# Patient Record
Sex: Female | Born: 1975
Health system: Southern US, Community
[De-identification: ages and names within clinical notes are randomized; demographics above are authoritative.]

## PROBLEM LIST (undated history)

## (undated) DIAGNOSIS — Z8632 Personal history of gestational diabetes: Secondary | ICD-10-CM

## (undated) DIAGNOSIS — O2690 Pregnancy related conditions, unspecified, unspecified trimester: Secondary | ICD-10-CM

## (undated) DIAGNOSIS — T7840XA Allergy, unspecified, initial encounter: Secondary | ICD-10-CM

## (undated) DIAGNOSIS — O039 Complete or unspecified spontaneous abortion without complication: Secondary | ICD-10-CM

## (undated) DIAGNOSIS — I10 Essential (primary) hypertension: Secondary | ICD-10-CM

## (undated) DIAGNOSIS — O34219 Maternal care for unspecified type scar from previous cesarean delivery: Secondary | ICD-10-CM

## (undated) HISTORY — DX: Personal history of gestational diabetes: Z86.32

## (undated) HISTORY — DX: Allergy, unspecified, initial encounter: T78.40XA

## (undated) HISTORY — DX: Essential (primary) hypertension: I10

## (undated) HISTORY — PX: BREAST CYST EXCISION: SHX579

## (undated) HISTORY — DX: Complete or unspecified spontaneous abortion without complication: O03.9

---

## 2007-10-14 ENCOUNTER — Inpatient Hospital Stay (HOSPITAL_COMMUNITY): Admission: AD | Admit: 2007-10-14 | Discharge: 2007-10-15 | Payer: Self-pay | Admitting: Obstetrics & Gynecology

## 2007-10-17 ENCOUNTER — Inpatient Hospital Stay (HOSPITAL_COMMUNITY): Admission: AD | Admit: 2007-10-17 | Discharge: 2007-10-17 | Payer: Self-pay | Admitting: Gynecology

## 2007-10-19 ENCOUNTER — Inpatient Hospital Stay (HOSPITAL_COMMUNITY): Admission: AD | Admit: 2007-10-19 | Discharge: 2007-10-19 | Payer: Self-pay | Admitting: Obstetrics & Gynecology

## 2008-02-07 ENCOUNTER — Ambulatory Visit (HOSPITAL_COMMUNITY): Admission: RE | Admit: 2008-02-07 | Discharge: 2008-02-07 | Payer: Self-pay | Admitting: Obstetrics and Gynecology

## 2008-03-06 ENCOUNTER — Ambulatory Visit (HOSPITAL_COMMUNITY): Admission: RE | Admit: 2008-03-06 | Discharge: 2008-03-06 | Payer: Self-pay | Admitting: Obstetrics and Gynecology

## 2008-04-22 ENCOUNTER — Inpatient Hospital Stay (HOSPITAL_COMMUNITY): Admission: AD | Admit: 2008-04-22 | Discharge: 2008-04-22 | Payer: Self-pay | Admitting: Obstetrics and Gynecology

## 2008-05-28 ENCOUNTER — Inpatient Hospital Stay (HOSPITAL_COMMUNITY): Admission: AD | Admit: 2008-05-28 | Discharge: 2008-06-01 | Payer: Self-pay | Admitting: Obstetrics and Gynecology

## 2008-11-06 ENCOUNTER — Ambulatory Visit: Payer: Self-pay | Admitting: Family Medicine

## 2008-11-06 DIAGNOSIS — J45909 Unspecified asthma, uncomplicated: Secondary | ICD-10-CM | POA: Insufficient documentation

## 2008-11-06 DIAGNOSIS — I10 Essential (primary) hypertension: Secondary | ICD-10-CM | POA: Insufficient documentation

## 2008-11-06 DIAGNOSIS — J309 Allergic rhinitis, unspecified: Secondary | ICD-10-CM | POA: Insufficient documentation

## 2008-12-04 ENCOUNTER — Encounter (INDEPENDENT_AMBULATORY_CARE_PROVIDER_SITE_OTHER): Payer: Self-pay | Admitting: *Deleted

## 2008-12-04 ENCOUNTER — Ambulatory Visit: Payer: Self-pay | Admitting: Family Medicine

## 2008-12-04 LAB — CONVERTED CEMR LAB
ALT: 29 units/L (ref 0–35)
AST: 23 units/L (ref 0–37)
Albumin: 4.2 g/dL (ref 3.5–5.2)
Alkaline Phosphatase: 69 units/L (ref 39–117)
Basophils Relative: 0.1 % (ref 0.0–3.0)
Bilirubin, Direct: 0 mg/dL (ref 0.0–0.3)
CO2: 27 meq/L (ref 19–32)
Calcium: 9.1 mg/dL (ref 8.4–10.5)
Chloride: 105 meq/L (ref 96–112)
Creatinine, Ser: 0.7 mg/dL (ref 0.4–1.2)
Eosinophils Absolute: 0.1 10*3/uL (ref 0.0–0.7)
Eosinophils Relative: 1.7 % (ref 0.0–5.0)
Hemoglobin: 14.3 g/dL (ref 12.0–15.0)
Lymphocytes Relative: 37.3 % (ref 12.0–46.0)
MCHC: 34.8 g/dL (ref 30.0–36.0)
Neutro Abs: 3.6 10*3/uL (ref 1.4–7.7)
Neutrophils Relative %: 52.1 % (ref 43.0–77.0)
RBC: 4.69 M/uL (ref 3.87–5.11)
Sodium: 140 meq/L (ref 135–145)
Total CHOL/HDL Ratio: 5
Total Protein: 8.4 g/dL — ABNORMAL HIGH (ref 6.0–8.3)
Triglycerides: 220 mg/dL — ABNORMAL HIGH (ref 0.0–149.0)
VLDL: 44 mg/dL — ABNORMAL HIGH (ref 0.0–40.0)
WBC: 6.9 10*3/uL (ref 4.5–10.5)

## 2009-02-26 ENCOUNTER — Ambulatory Visit: Payer: Self-pay | Admitting: Family Medicine

## 2009-06-11 ENCOUNTER — Ambulatory Visit: Payer: Self-pay | Admitting: Family

## 2009-06-11 DIAGNOSIS — E1169 Type 2 diabetes mellitus with other specified complication: Secondary | ICD-10-CM | POA: Insufficient documentation

## 2009-06-11 DIAGNOSIS — E781 Pure hyperglyceridemia: Secondary | ICD-10-CM | POA: Insufficient documentation

## 2009-06-11 DIAGNOSIS — R7309 Other abnormal glucose: Secondary | ICD-10-CM | POA: Insufficient documentation

## 2009-06-11 LAB — CONVERTED CEMR LAB
BUN: 10 mg/dL (ref 6–23)
CO2: 27 meq/L (ref 19–32)
Calcium: 9.4 mg/dL (ref 8.4–10.5)
Chloride: 107 meq/L (ref 96–112)
Creatinine, Ser: 0.7 mg/dL (ref 0.4–1.2)
Direct LDL: 145.3 mg/dL
Glucose, Bld: 110 mg/dL — ABNORMAL HIGH (ref 70–99)
Total CHOL/HDL Ratio: 4
Triglycerides: 140 mg/dL (ref 0.0–149.0)

## 2009-06-15 ENCOUNTER — Telehealth: Payer: Self-pay | Admitting: Family

## 2009-06-18 ENCOUNTER — Ambulatory Visit: Payer: Self-pay | Admitting: Family

## 2009-06-22 LAB — CONVERTED CEMR LAB: Hgb A1c MFr Bld: 6.1 % (ref 4.6–6.5)

## 2009-06-25 ENCOUNTER — Ambulatory Visit: Payer: Self-pay | Admitting: Family

## 2009-06-25 DIAGNOSIS — Z8632 Personal history of gestational diabetes: Secondary | ICD-10-CM | POA: Insufficient documentation

## 2009-06-25 DIAGNOSIS — R809 Proteinuria, unspecified: Secondary | ICD-10-CM | POA: Insufficient documentation

## 2009-06-25 LAB — CONVERTED CEMR LAB
Creatinine,U: 190.3 mg/dL
Microalb Creat Ratio: 26.3 mg/g (ref 0.0–30.0)

## 2009-06-27 ENCOUNTER — Encounter (INDEPENDENT_AMBULATORY_CARE_PROVIDER_SITE_OTHER): Payer: Self-pay | Admitting: *Deleted

## 2009-07-04 ENCOUNTER — Encounter: Payer: Self-pay | Admitting: Family

## 2009-07-07 ENCOUNTER — Emergency Department (HOSPITAL_COMMUNITY): Admission: EM | Admit: 2009-07-07 | Discharge: 2009-07-07 | Payer: Self-pay | Admitting: Family Medicine

## 2009-07-11 ENCOUNTER — Encounter (INDEPENDENT_AMBULATORY_CARE_PROVIDER_SITE_OTHER): Payer: Self-pay | Admitting: *Deleted

## 2009-10-02 ENCOUNTER — Ambulatory Visit: Payer: Self-pay | Admitting: Family Medicine

## 2009-12-31 ENCOUNTER — Ambulatory Visit: Payer: Self-pay | Admitting: Family Medicine

## 2009-12-31 LAB — CONVERTED CEMR LAB
AST: 19 units/L (ref 0–37)
BUN: 13 mg/dL (ref 6–23)
Basophils Relative: 0.4 % (ref 0.0–3.0)
Bilirubin, Direct: 0.1 mg/dL (ref 0.0–0.3)
CO2: 24 meq/L (ref 19–32)
Calcium: 9.1 mg/dL (ref 8.4–10.5)
Eosinophils Relative: 1.1 % (ref 0.0–5.0)
Glucose, Bld: 96 mg/dL (ref 70–99)
HCT: 37.6 % (ref 36.0–46.0)
HDL: 36.2 mg/dL — ABNORMAL LOW (ref >39.00)
Lymphs Abs: 2.4 10*3/uL (ref 0.7–4.0)
MCHC: 34.4 g/dL (ref 30.0–36.0)
MCV: 87.7 fL (ref 78.0–100.0)
Monocytes Absolute: 0.5 10*3/uL (ref 0.1–1.0)
RBC: 4.28 M/uL (ref 3.87–5.11)
Sodium: 140 meq/L (ref 135–145)
Total Bilirubin: 0.5 mg/dL (ref 0.3–1.2)
Total CHOL/HDL Ratio: 4
VLDL: 17.4 mg/dL (ref 0.0–40.0)
WBC: 6.7 10*3/uL (ref 4.5–10.5)

## 2010-02-26 ENCOUNTER — Ambulatory Visit (HOSPITAL_COMMUNITY): Admission: RE | Admit: 2010-02-26 | Discharge: 2010-02-26 | Payer: Self-pay | Admitting: Obstetrics and Gynecology

## 2010-04-01 ENCOUNTER — Ambulatory Visit: Payer: Self-pay | Admitting: Family Medicine

## 2010-04-11 DIAGNOSIS — O039 Complete or unspecified spontaneous abortion without complication: Secondary | ICD-10-CM | POA: Insufficient documentation

## 2010-04-11 DIAGNOSIS — J019 Acute sinusitis, unspecified: Secondary | ICD-10-CM | POA: Insufficient documentation

## 2010-04-12 LAB — CONVERTED CEMR LAB: Hgb A1c MFr Bld: 6 % (ref 4.6–6.5)

## 2010-04-14 HISTORY — DX: Maternal care for unspecified type scar from previous cesarean delivery: O34.219

## 2010-05-04 ENCOUNTER — Encounter: Payer: Self-pay | Admitting: Obstetrics and Gynecology

## 2010-05-14 NOTE — Assessment & Plan Note (Signed)
Summary: 3 month roa//lch   Vital Signs:  Patient profile:   35 year old female Weight:      181 pounds Pulse rate:   82 / minute BP sitting:   126 / 80  (left arm)  Vitals Entered By: Doristine Devoid (October 02, 2009 9:13 AM) CC: 3 MONTH ROA    History of Present Illness: 35 yo woman here today for 3 month f/u on  1) DM-  has lost 5 lbs.  not on meds, focusing on diet and exercise.  no CP, SOB, HAs, visual changes, edema.  not checking sugars.  needs an ACE but still breast feeding.  2) HTN- BP adequately controlled on Labetalol.  asymptomatic- see above.  3) Hyperlipidemia- will recheck lipids in September at CPE.  pt attempting to lower levels w/ diet and exercise.  Preventive Screening-Counseling & Management  Alcohol-Tobacco     Alcohol drinks/day: 0     Smoking Status: never  Caffeine-Diet-Exercise     Does Patient Exercise: yes      Sexual History:  multiple partners currently.        Drug Use:  never.    Current Medications (verified): 1)  Ventolin Hfa 108 (90 Base) Mcg/act Aers (Albuterol Sulfate) .... 2 Puffs Q4 As Needed For Cough or Wheezing 2)  Qvar 40 Mcg/act Aers (Beclomethasone Dipropionate) .... 2 Puffs Two Times A Day 3)  Labetalol Hcl 300 Mg Tabs (Labetalol Hcl) .Marland Kitchen.. 1 Tab By Mouth Two Times A Day. 4)  Nasonex 50 Mcg/act Susp (Mometasone Furoate) .... 2 Sprays Each Nostril Once Daily  Allergies (verified): No Known Drug Allergies  Past History:  Past Medical History: Last updated: 12/04/2008 Asthma HTN  Social History: Last updated: 11/06/2008 married, from [place] daughter, Elonda Husky (2010)  Social History: Does Patient Exercise:  yes Sexual History:  multiple partners currently  Review of Systems      See HPI  Physical Exam  General:  Well-developed,well-nourished,in no acute distress; alert,appropriate and cooperative throughout examination Head:  Normocephalic and atraumatic without obvious abnormalities. No apparent alopecia  or balding. Neck:  No deformities, masses, or tenderness noted. Lungs:  Normal respiratory effort, chest expands symmetrically. Lungs are clear to auscultation, no crackles or wheezes. Heart:  Normal rate and regular rhythm. S1 and S2 normal without gallop, murmur, click, rub or other extra sounds. Pulses:  +2 carotid, radial, DP Extremities:  No clubbing, cyanosis, edema, or deformity noted   Impression & Recommendations:  Problem # 1:  DM (ICD-250.00) Assessment Unchanged pt's last A1C 6.1.  not checking sugars.  working on diet and exercise.  needs ACE due to microalbumin but pt is breast feeding.  will follow closely. Orders: Venipuncture ([address]) TLB-A1C / Hgb A1C (Glycohemoglobin) (83036-A1C)  Problem # 2:  HYPERTENSION (ICD-401.9) Assessment: Unchanged BP adequately controlled.  asymptomatic.  continue meds. Her updated medication list for this problem includes:    Labetalol Hcl 300 Mg Tabs (Labetalol hcl) .Marland Kitchen... 1 tab by mouth two times a day.  Problem # 3:  HYPERLIPIDEMIA (ICD-272.4) Assessment: Unchanged pt's LDL 145.  if treating pt as diabetic LDL goal is <70.  will repeat labs in 3 months.  if still not at goal will need to discuss meds although pt tells me she is trying for a 2nd baby.  statin would not be appropriate if intending pregnancy.  Complete Medication List: 1)  Ventolin Hfa 108 (90 Base) Mcg/act Aers (Albuterol sulfate) .... 2 puffs q4 as needed for cough or wheezing 2)  Qvar  40 Mcg/act Aers (Beclomethasone dipropionate) .... 2 puffs two times a day 3)  Labetalol Hcl 300 Mg Tabs (Labetalol hcl) .Marland Kitchen.. 1 tab by mouth two times a day. 4)  Nasonex 50 Mcg/act Susp (Mometasone furoate) .... 2 sprays each nostril once daily  Patient Instructions: 1)  Please schedule your complete physical in Sept- do not eat before this appt 2)  Keep up the good work on diet and exercise- you're doing great! 3)  Call with any questions or concerns 4)  Have a great  trip! Prescriptions: LABETALOL HCL 300 MG TABS (LABETALOL HCL) 1 tab by mouth two times a day.  #60 x 3   Entered and Authorized by:   Neena Rhymes MD   Signed by:   Neena Rhymes MD on 10/02/2009   Method used:   [facility] to        [facility] [address] [phone]* (retail)       [address]       [place], [place]  [address]       Ph: [phone] or [phone]       Fax: [phone]   RxID:   [id number] VENTOLIN HFA 108 (90 BASE) MCG/ACT AERS (ALBUTEROL SULFATE) 2 puffs Q4 as needed for cough or wheezing  #1 x 3   Entered and Authorized by:   Neena Rhymes MD   Signed by:   Neena Rhymes MD on 10/02/2009   Method used:   [facility]        [facility] [address] [phone]* (retail)       [address]       [place], [place]  [address]       Ph: [phone] or [phone]       Fax: [phone]   RxID:   [id number]

## 2010-05-14 NOTE — Assessment & Plan Note (Signed)
Summary: 6 MONTH FOLLOWUP/ALR   Vital Signs:  Patient profile:   35 year old female Weight:      186 pounds BMI:     34.70 Temp:     98.1 degrees F oral Pulse rate:   68 / minute Pulse rhythm:   regular Resp:     16 per minute BP sitting:   128 / 80  (right arm) Cuff size:   regular  Vitals Entered By: Mervin Kung CMA (June 11, 2009 8:11 AM) CC: room 17  6 month follow up Is Patient Diabetic? No   CC:  room 17  6 month follow up.  History of Present Illness: Amy Torres is a 35 year old female who presents today for f/u of htn and cholesterol.  She notes that she continues to take labetalol.  Tries to watch sodium and cholesterol.  Denies lower extremity swelling of HA.    Allergies (verified): No Known Drug Allergies  Physical Exam  General:  overweight female in NAD Head:  Normocephalic and atraumatic without obvious abnormalities. No apparent alopecia or balding. Lungs:  Normal respiratory effort, chest expands symmetrically. Lungs are clear to auscultation, no crackles or wheezes. Heart:  Normal rate and regular rhythm. S1 and S2 normal without gallop, murmur, click, rub or other extra sounds.   Impression & Recommendations:  Problem # 1:  HYPERTENSION (ICD-401.9) Assessment Improved Plan to continue Labetalol, check BMET Her updated medication list for this problem includes:    Labetalol Hcl 300 Mg Tabs (Labetalol hcl) .Marland Kitchen... 1 tab by mouth two times a day.  BP today: 128/80 Prior BP: 130/88 (12/04/2008)  Labs Reviewed: K+: 3.7 (12/04/2008) Creat: : 0.7 (12/04/2008)   Chol: 187 (12/04/2008)   HDL: 35.20 (12/04/2008)   TG: 220.0 (12/04/2008)  Orders: Venipuncture ([address]) TLB-BMP (Basic Metabolic Panel-BMET) (80048-METABOL) TLB-Lipid Panel (80061-LIPID)  Problem # 2:  HYPERTRIGLYCERIDEMIA, MILD (ICD-272.4) follow up FLP, patient counselled on diet, exercise, weight loss  Complete Medication List: 1)  Jolivette 0.35 Mg Tabs (Norethindrone  (contraceptive)) .... Take one tablet daily 2)  Ventolin Hfa 108 (90 Base) Mcg/act Aers (Albuterol sulfate) .... 2 puffs q4 as needed for cough or wheezing 3)  Qvar 40 Mcg/act Aers (Beclomethasone dipropionate) .... 2 puffs two times a day 4)  Labetalol Hcl 300 Mg Tabs (Labetalol hcl) .Marland Kitchen.. 1 tab by mouth two times a day. 5)  Nasonex 50 Mcg/act Susp (Mometasone furoate) .... 2 sprays each nostril once daily  Patient Instructions: 1)  Limit your Sodium (Salt). 2)  Continue to work hard on diet and exercise and weight loss.   3)  Please arrange a follow up this summer for a complete physical Prescriptions: NASONEX 50 MCG/ACT SUSP (MOMETASONE FUROATE) 2 sprays each nostril once daily  #1 x 3   Entered and Authorized by:   Lemont Fillers FNP   Signed by:   Lemont Fillers FNP on 06/11/2009   Method used:   [facility]        [facility] [address] [phone]* (retail)       [address]       [place], [place]  [address]       Ph: [phone] or [phone]       Fax: [phone]   RxID:   [id number] LABETALOL HCL 300 MG TABS (LABETALOL HCL) 1 tab by mouth two times a day.  #60 x 3   Entered and Authorized by:   Efraim Kaufmann  Arvil Chaco FNP   Signed by:   Lemont Fillers FNP on 06/11/2009   Method used:   Electronically to        [facility] [address] [phone]* (retail)       [address]       [place], [place]  [address]       Ph: [phone] or [phone]       Fax: [phone]   RxID:   [id number] QVAR 40 MCG/ACT AERS (BECLOMETHASONE DIPROPIONATE) 2 puffs two times a day  #1 x 3   Entered and Authorized by:   Lemont Fillers FNP   Signed by:   Lemont Fillers FNP on 06/11/2009   Method used:   Electronically to        [facility] [address] [phone]* (retail)       [address]       [place], [place]  [address]       Ph: [phone] or [phone]       Fax: [phone]   RxID:    [id number] VENTOLIN HFA 108 (90 BASE) MCG/ACT AERS (ALBUTEROL SULFATE) 2 puffs Q4 as needed for cough or wheezing  #1 x 3   Entered and Authorized by:   Lemont Fillers FNP   Signed by:   Lemont Fillers FNP on 06/11/2009   Method used:   Electronically to        [facility] [address] [phone]* (retail)       [address]       [place], [place]  [address]       Ph: [phone] or [phone]       Fax: [phone]   RxID:   [id number]   Current Allergies (reviewed today): No known allergies      Preventive Care Screening  Pap Smear:    Date:  12/13/2008    Results:  normal

## 2010-05-14 NOTE — Progress Notes (Signed)
Summary: lab result--lmom  Phone Note Outgoing Call   Summary of Call: Pls call patient and have her return for an A1C (790.29).  Please let her know that her sugar is slightly elevated and I would like to do some further testing.  Also please let her know that her cholesterol is elevated.  She should work hard on a low fat, low cholesterol diet and avoid concentrated sweets.  Initial call taken by: Lemont Fillers FNP,  June 15, 2009 9:23 AM  Follow-up for Phone Call        Left message on machine to return call. Follow-up by: Mervin Kung CMA,  June 15, 2009 9:58 AM  Additional Follow-up for Phone Call Additional follow up Details #1::        Pt notified of lab results. Appt scheduled for A1C for 06/18/09 at 10:00 [facility]. Additional Follow-up by: Mervin Kung CMA,  June 15, 2009 3:50 PM

## 2010-05-14 NOTE — Letter (Signed)
Summary: Primary Care Consult Scheduled Letter  [facility] [place]  [address] [place], [place] [address]   Phone: [phone]  Fax: [phone]      06/27/2009 MRN: [id number]  Amy Torres [address] [place], [place]  [address]    Dear [name],    We have scheduled an appointment for you.  At the recommendation of Sandford Craze, FNP, we have scheduled you a consult with [facility] on 07-04-2009 arrive by 9:50am.  Their address is [address], [place] [address]. The office phone number is [phone].  If this appointment day and time is not convenient for you, please feel free to call the office of the doctor you are being referred to at the number listed above and reschedule the appointment.    It is important for you to keep your scheduled appointments. We are here to make sure you are given good patient care.   Thank you,    Renee, Patient Care Coordinator  at [facility]

## 2010-05-14 NOTE — Assessment & Plan Note (Signed)
Summary: F/U TO DISCUSS BS / TF   Vital Signs:  Patient profile:   35 year old female Weight:      185.50 pounds BMI:     34.61 O2 Sat:      98 % on Room air Temp:     97.1 degrees F oral Pulse rate:   78 / minute Pulse rhythm:   regular Resp:     20 per minute BP sitting:   108 / 60  (right arm) Cuff size:   l97.1  Vitals Entered By: Mervin Kung CMA (June 25, 2009 9:10 AM)  O2 Flow:  Room air CC: room 16 follow up to discuss elevated Hgb a1c   CC:  room 16 follow up to discuss elevated Hgb a1c.  History of Present Illness: Amy Torres is a 35 yr old female who presents today for follow up of her newly diagnose diabetes.  Notes that she dose have nocturia also notes + thirst, but she is currently breast feeding. Denies any family history of diabetes.  Her diet is very rich in rice.  Tells me that her husband is also diabetic.    Allergies (verified): No Known Drug Allergies  Physical Exam  General:  Well-developed,well-nourished,in no acute distress; alert,appropriate and cooperative throughout examination Psych:  Cognition and judgment appear intact. Alert and cooperative with normal attention span and concentration. No apparent delusions, illusions, hallucinations   Impression & Recommendations:  Problem # 1:  DM (ICD-250.00) Assessment New  Declines referral to opthalmology or podiatry.  Will check microalbumin.  15 minutes spent with patient.  Greater than 50% of this time was spent counseling patient on diabetic diet, weight loss, foot care and exercise.    Orders: Podiatry Referral (Podiatry) TLB-Microalbumin/Creat Ratio, Urine (82043-MALB)  Problem # 2:  HYPERLIPIDEMIA (ICD-272.4) Assessment: New Given new diagnosis of hyperlipidemia, pt's LDL is not at goal.  I educated patient on low cholesterol diet.  She is currently lactating.  Will work on diet and exercise for now.  Pt to f/u in 3 months.  Complete Medication List: 1)  Ventolin Hfa 108 (90 Base)  Mcg/act Aers (Albuterol sulfate) .... 2 puffs q4 as needed for cough or wheezing 2)  Qvar 40 Mcg/act Aers (Beclomethasone dipropionate) .... 2 puffs two times a day 3)  Labetalol Hcl 300 Mg Tabs (Labetalol hcl) .Marland Kitchen.. 1 tab by mouth two times a day. 4)  Nasonex 50 Mcg/act Susp (Mometasone furoate) .... 2 sprays each nostril once daily  Patient Instructions: 1)  Please work hard on diabetic diet and low cholesterol diet. 2)  Please arrange an eye examination. 3)  Please follow up in 3 months fasting.    Current Allergies (reviewed today): No known allergies

## 2010-05-14 NOTE — Miscellaneous (Signed)
Summary: foot exam  Clinical Lists Changes  Observations: Added new observation of PODIATRIST: Dr. Larey Dresser (07/04/2009 9:53) Added new observation of DIABFOOTDPM: no diabetic findings (07/04/2009 9:53)        Diabetes Management Exam:    Foot Exam by Podiatrist:       Date: 07/04/2009       Results: no diabetic findings       Done by: Dr. Larey Dresser

## 2010-05-14 NOTE — Assessment & Plan Note (Signed)
Summary: cpx & lab/cbs   Vital Signs:  Patient profile:   35 year old female Height:      61.5 inches Weight:      178 pounds Temp:     97.6 degrees F oral Pulse rate:   80 / minute Resp:     20 per minute BP sitting:   138 / 80  (left arm)  Vitals Entered By: Jeremy Johann CMA (December 31, 2009 8:06 AM) CC: cpx, fasting, no pap, flu shot   History of Present Illness: 35 yo woman here today for CPE.  has GYN appt next week for pap/breast exam  1) DM- controlling w/ diet and exercise.  down another 3 lbs.  denies symptomatic lows.  Preventive Screening-Counseling & Management  Alcohol-Tobacco     Alcohol drinks/day: 0     Smoking Status: never  Caffeine-Diet-Exercise     Does Patient Exercise: no      Sexual History:  currently monogamous.        Drug Use:  never.    Current Medications (verified): 1)  Ventolin Hfa 108 (90 Base) Mcg/act Aers (Albuterol Sulfate) .... 2 Puffs Q4 As Needed For Cough or Wheezing 2)  Qvar 40 Mcg/act Aers (Beclomethasone Dipropionate) .... 2 Puffs Two Times A Day 3)  Labetalol Hcl 300 Mg Tabs (Labetalol Hcl) .Marland Kitchen.. 1 Tab By Mouth Two Times A Day. 4)  Nasonex 50 Mcg/act Susp (Mometasone Furoate) .... 2 Sprays Each Nostril Once Daily  Allergies (verified): No Known Drug Allergies  Past History:  Past Medical History: Last updated: 12/04/2008 Asthma HTN  Family History: Last updated: 11/06/2008 CAD-no HTN-mother DM-no STROKE-mother COLON CA-no BREAST CA-maternal aunt 50's                      paternal aunt 20's  Social History: Last updated: 11/06/2008 married, from [place] daughter, Elonda Husky (2010)  Social History: Does Patient Exercise:  no Sexual History:  currently monogamous  Review of Systems  The patient denies anorexia, fever, weight loss, weight gain, vision loss, decreased hearing, hoarseness, chest pain, syncope, dyspnea on exertion, peripheral edema, prolonged cough, headaches, abdominal pain, melena,  hematochezia, severe indigestion/heartburn, hematuria, suspicious skin lesions, depression, abnormal bleeding, enlarged lymph nodes, and breast masses.    Physical Exam  General:  Well-developed,well-nourished,in no acute distress; alert,appropriate and cooperative throughout examination Head:  Normocephalic and atraumatic without obvious abnormalities. No apparent alopecia or balding. Eyes:  No corneal or conjunctival inflammation noted. EOMI. Perrla. Funduscopic exam benign, without hemorrhages, exudates or papilledema. Vision grossly normal. Ears:  External ear exam shows no significant lesions or deformities.  Otoscopic examination reveals clear canals, tympanic membranes are intact bilaterally without bulging, retraction, inflammation or discharge. Hearing is grossly normal bilaterally. Nose:  mildly edematous turbinates Mouth:  Oral mucosa and oropharynx without lesions or exudates.  Teeth in good repair. Neck:  No deformities, masses, or tenderness noted. Breasts:  deferred to gyn Lungs:  Normal respiratory effort, chest expands symmetrically. Lungs are clear to auscultation, no crackles or wheezes. Heart:  Normal rate and regular rhythm. S1 and S2 normal without gallop, murmur, click, rub or other extra sounds. Abdomen:  Bowel sounds positive,abdomen soft and non-tender without masses, organomegaly or hernias noted. Genitalia:  deferred to gyn Pulses:  +2 carotid, radial, DP Extremities:  No clubbing, cyanosis, edema, or deformity noted Neurologic:  No cranial nerve deficits noted. Station and gait are normal. Plantar reflexes are down-going bilaterally. DTRs are symmetrical throughout. Sensory, motor and coordinative functions  appear intact. Skin:  Intact without suspicious lesions or rashes small wart on R 4th finger pad Cervical Nodes:  No lymphadenopathy noted Axillary Nodes:  No palpable lymphadenopathy Psych:  Cognition and judgment appear intact. Alert and cooperative with  normal attention span and concentration. No apparent delusions, illusions, hallucinations   Impression & Recommendations:  Problem # 1:  PHYSICAL EXAMINATION (ICD-V70.0) Assessment Unchanged pt's PE WNL.  flu shot given.  check labs.  anticipatory guidance provided.  Problem # 2:  DM (ICD-250.00) Assessment: Unchanged trying to control w/ diet and exercise.  pt not exercising regularly- stressed the importance.  check labs and adjust regimen as needed. Orders: Venipuncture (51884) TLB-A1C / Hgb A1C (Glycohemoglobin) (83036-A1C) TLB-BMP (Basic Metabolic Panel-BMET) (80048-METABOL) TLB-TSH (Thyroid Stimulating Hormone) (84443-TSH)  Problem # 3:  HYPERLIPIDEMIA (ICD-272.4) Assessment: Unchanged due for labs.  pt attempting to have 2nd child.  statin not appropriate at this time. Orders: TLB-Lipid Panel (80061-LIPID) TLB-Hepatic/Liver Function Pnl (80076-HEPATIC)  Problem # 4:  HYPERTENSION (ICD-401.9) Assessment: Unchanged adequate control.  pt desires pregnancy- no ACE Her updated medication list for this problem includes:    Labetalol Hcl 300 Mg Tabs (Labetalol hcl) .Marland Kitchen... 1 tab by mouth two times a day.  Orders: TLB-CBC Platelet - w/Differential (85025-CBCD)  Problem # 5:  PROTEINURIA (ICD-791.0) Assessment: Unchanged check labs.  pt desires pregnancy- no ACE Orders: TLB-Microalbumin/Creat Ratio, Urine (82043-MALB)  Complete Medication List: 1)  Ventolin Hfa 108 (90 Base) Mcg/act Aers (Albuterol sulfate) .... 2 puffs q4 as needed for cough or wheezing 2)  Qvar 40 Mcg/act Aers (Beclomethasone dipropionate) .... 2 puffs two times a day 3)  Labetalol Hcl 300 Mg Tabs (Labetalol hcl) .Marland Kitchen.. 1 tab by mouth two times a day. 4)  Nasonex 50 Mcg/act Susp (Mometasone furoate) .... 2 sprays each nostril once daily  Other Orders: Admin 1st Vaccine ([address]) Flu Vaccine 64yrs + (30160)  Patient Instructions: 1)  Follow up in 3 months for your diabetes check 2)  Your exam looks great!!   Keep up the good work! 3)  We'll notify you of your lab results 4)  Call with any questions or concerns 5)  Ask your GYN about early mammogram screening b/c of your family history 6)  Have a great fall season! Flu Vaccine Consent Questions     Do you have a history of severe allergic reactions to this vaccine? no    Any prior history of allergic reactions to egg and/or gelatin? no    Do you have a sensitivity to the preservative Thimersol? no    Do you have a past history of Guillan-Barre Syndrome? no    Do you currently have an acute febrile illness? no    Have you ever had a severe reaction to latex? no    Vaccine information given and explained to patient? yes    Are you currently pregnant? no    Lot Number:[id number]   Exp Date:10/11/2009   Site Given  Left Deltoid IMlu \

## 2010-05-14 NOTE — Consult Note (Signed)
Summary: [facility]   Imported By: Lanelle Bal 07/17/2009 10:07:39  _____________________________________________________________________  External Attachment:    Type:   Image     Comment:   External Document

## 2010-05-16 NOTE — Assessment & Plan Note (Signed)
Summary: rto 3 months.cbs   Vital Signs:  Patient profile:   35 year old female Weight:      185 pounds BMI:     34.51 Pulse rate:   88 / minute BP sitting:   120 / 78  (left arm)  Vitals Entered By: Doristine Devoid CMA (April 11, 2010 9:12 AM) CC: 3 month roa and labs   History of Present Illness: 35 yo woman here today for f/u  1) DM- not checking sugars.  stopped exercising due to weather.  watching diet closely.  no CP, SOB, HAs, visual changes, edema.  2) URI- sxs started yesterday w/ runny nose.  no fevers.  no ear pain.  intermittant dry cough.  + facial pain/pressure over frontal sinuses.  3) Miscarriage- pt miscarried at [redacted] weeks gestation earlier this month.  pt obviously upset by this but says she is doing well.  reports husband is being supportive but won't make eye contact.  Preventive Screening-Counseling & Management  Alcohol-Tobacco     Alcohol drinks/day: 0     Smoking Status: never  Caffeine-Diet-Exercise     Does Patient Exercise: no      Sexual History:  currently monogamous.    Current Medications (verified): 1)  Ventolin Hfa 108 (90 Base) Mcg/act Aers (Albuterol Sulfate) .... 2 Puffs Q4 As Needed For Cough or Wheezing 2)  Qvar 40 Mcg/act Aers (Beclomethasone Dipropionate) .... 2 Puffs Two Times A Day 3)  Labetalol Hcl 300 Mg Tabs (Labetalol Hcl) .Marland Kitchen.. 1 Tab By Mouth Two Times A Day. 4)  Nasonex 50 Mcg/act Susp (Mometasone Furoate) .... 2 Sprays Each Nostril Once Daily  Allergies (verified): No Known Drug Allergies  Past History:  Past medical, surgical, family and social histories (including risk factors) reviewed, and no changes noted (except as noted below).  Past Medical History: Asthma HTN DM Miscarriage- 12/11  Past Surgical History: Reviewed history from 11/06/2008 and no changes required. L breast cyst removed   Family History: Reviewed history from 11/06/2008 and no changes  required. CAD-no HTN-mother DM-no STROKE-mother COLON CA-no BREAST CA-maternal aunt 52's                      paternal aunt 20's  Social History: Reviewed history from 11/06/2008 and no changes required. married, [place] daughter, Elonda Husky (2010)  Review of Systems      See HPI  Physical Exam  General:  Well-developed,well-nourished,in no acute distress; alert,appropriate and cooperative throughout examination Head:  Normocephalic and atraumatic without obvious abnormalities. No apparent alopecia or balding.  + TTP over frontal and maxillary sinuses Eyes:  no injxn or inflammation Ears:  External ear exam shows no significant lesions or deformities.  Otoscopic examination reveals clear canals, tympanic membranes are intact bilaterally without bulging, retraction, inflammation or discharge. Hearing is grossly normal bilaterally. Nose:  mildly edematous turbinates Mouth:  Oral mucosa and oropharynx without lesions or exudates.  Teeth in good repair. Neck:  No deformities, masses, or tenderness noted. Lungs:  Normal respiratory effort, chest expands symmetrically. Lungs are clear to auscultation, no crackles or wheezes. Heart:  Normal rate and regular rhythm. S1 and S2 normal without gallop, murmur, click, rub or other extra sounds. Pulses:  +2 carotid, radial, DP Extremities:  No clubbing, cyanosis, edema, or deformity noted Psych:  withdrawn, poor eye contact   Impression & Recommendations:  Problem # 1:  DM (ICD-250.00) Assessment Unchanged pt has gained weight over the holidays and admits to eating for comfort after  miscarriage.  will check sugars.  encouraged regular exercise and healthy food choices. Orders: Venipuncture ([id number]) TLB-A1C / Hgb A1C (Glycohemoglobin) (83036-A1C)  Problem # 2:  SINUSITIS - ACUTE-NOS (ICD-461.9) Assessment: New pt w/ obvious sinus infxn.  start amox.  reviewed supportive care and red flags that should prompt return.  Pt expresses  understanding and is in agreement w/ this plan. Her updated medication list for this problem includes:    Nasonex 50 Mcg/act Susp (Mometasone furoate) .Marland Kitchen... 2 sprays each nostril once daily    Amoxicillin 500 Mg Tabs (Amoxicillin) .Marland Kitchen... 2 tabs by mouth two times a day x10 days.  take w/ food.  Problem # 3:  ABORTION, SPONTANEOUS (ICD-634.90) Assessment: New miscarried last month at 9 weeks.  pt obviously upset by this but not willing to open up.  offered support- pt appreciative.  Complete Medication List: 1)  Ventolin Hfa 108 (90 Base) Mcg/act Aers (Albuterol sulfate) .... 2 puffs q4 as needed for cough or wheezing 2)  Qvar 40 Mcg/act Aers (Beclomethasone dipropionate) .... 2 puffs two times a day 3)  Labetalol Hcl 300 Mg Tabs (Labetalol hcl) .Marland Kitchen.. 1 tab by mouth two times a day. 4)  Nasonex 50 Mcg/act Susp (Mometasone furoate) .... 2 sprays each nostril once daily 5)  Amoxicillin 500 Mg Tabs (Amoxicillin) .... 2 tabs by mouth two times a day x10 days.  take w/ food.  Patient Instructions: 1)  Follow up in 3 months to recheck sugar and cholesterol- don't eat before this appt 2)  Take the Amoxicillin as directed for your sinus infection- take w/ food to avoid upset stomach 3)  Keep up the good work with your diet 4)  Try and get regular exercise 5)  Call with any questions or concerns 6)  Happy New Year! Prescriptions: AMOXICILLIN 500 MG TABS (AMOXICILLIN) 2 tabs by mouth two times a day x10 days.  take w/ food.  #40 x 0   Entered and Authorized by:   Neena Rhymes MD   Signed by:   Neena Rhymes MD on 04/11/2010   Method used:   Electronically to        [facility] [address] [phone]* (retail)       [address]       [place], [place]  [address]       Ph: [phone] or [phone]       Fax: [phone]   RxID:   [id number]    Orders Added: 1)  Venipuncture [36415] 2)  TLB-A1C / Hgb A1C (Glycohemoglobin) [83036-A1C] 3)  Est. Patient Level IV  [23557]

## 2010-06-04 ENCOUNTER — Encounter: Payer: Self-pay | Admitting: Family Medicine

## 2010-06-04 ENCOUNTER — Ambulatory Visit (INDEPENDENT_AMBULATORY_CARE_PROVIDER_SITE_OTHER): Payer: 59 | Admitting: Family Medicine

## 2010-06-04 DIAGNOSIS — M25579 Pain in unspecified ankle and joints of unspecified foot: Secondary | ICD-10-CM

## 2010-06-08 ENCOUNTER — Encounter: Payer: Self-pay | Admitting: Family Medicine

## 2010-06-11 NOTE — Assessment & Plan Note (Signed)
Summary: lump on right foot/kn   Vital Signs:  Patient profile:   35 year old female Weight:      189 pounds BMI:     35.26 Pulse rate:   72 / minute BP sitting:   114 / 68  (right arm)  Vitals Entered By: Doristine Devoid CMA (June 04, 2010 11:42 AM) CC: Lump on R ankle painful and swelling    History of Present Illness: 35 yo woman here today for R ankle pain.  last week noticed lump next to lateral malleolus.  has pain- particularly w/ walking.  intermittantly red.  denies injury.  has been using icy-hot w/out relief.  currently pregnant, unable to take NSAIDS  Current Medications (verified): 1)  Ventolin Hfa 108 (90 Base) Mcg/act Aers (Albuterol Sulfate) .... 2 Puffs Q4 As Needed For Cough or Wheezing 2)  Qvar 40 Mcg/act Aers (Beclomethasone Dipropionate) .... 2 Puffs Two Times A Day 3)  Labetalol Hcl 300 Mg Tabs (Labetalol Hcl) .Marland Kitchen.. 1 Tab By Mouth Two Times A Day. 4)  Nasonex 50 Mcg/act Susp (Mometasone Furoate) .... 2 Sprays Each Nostril Once Daily  Allergies (verified): No Known Drug Allergies  Review of Systems      See HPI  Physical Exam  General:  Well-developed,well-nourished,in no acute distress; alert,appropriate and cooperative throughout examination Msk:  rubbery nodule posterior and inferior to R lateral malleolus on or near peroneal tendon.  + TTP.  good dorsi and plantar flexion.  mild pain w/ inversion/eversion Pulses:  +2 DP/PT Extremities:  no C/C/E Neurologic:  sensation intact to light touch and gait normal.     Impression & Recommendations:  Problem # 1:  ANKLE PAIN (ICD-719.47) Assessment New pt w/ ? cyst along peroneal tendons.  given that she is pregnant she is unable to take NSAIDs.  will refer to sports med for [place] and tx.  Pt expresses understanding and is in agreement w/ this plan. Orders: Sports Medicine (Sports Med)  Complete Medication List: 1)  Ventolin Hfa 108 (90 Base) Mcg/act Aers (Albuterol sulfate) .... 2 puffs q4 as needed for  cough or wheezing 2)  Qvar 40 Mcg/act Aers (Beclomethasone dipropionate) .... 2 puffs two times a day 3)  Labetalol Hcl 300 Mg Tabs (Labetalol hcl) .Marland Kitchen.. 1 tab by mouth two times a day. 4)  Nasonex 50 Mcg/act Susp (Mometasone furoate) .... 2 sprays each nostril once daily  Patient Instructions: 1)  Ice the area for pain relief 2)  Elevate as much as possible 3)  Take tylenol for pain 4)  We'll let you know about your sports med appt 5)  Hang in there! 6)  Congrats!   Orders Added: 1)  Sports Medicine [Sports Med] 2)  Est. Patient Level III [41324]

## 2010-06-11 NOTE — Assessment & Plan Note (Signed)
Summary: ? CYST ON RT. PERITONEAL TENDONS/PAIN/NP/LP  PT IS PREGNANT/ ...   Vital Signs:  Patient profile:   35 year old female Height:      62 inches (157.48 cm) Weight:      188.8 pounds (85.82 kg) BMI:     34.66 Temp:     98.1 degrees F (36.72 degrees C) oral Pulse rate:   68 / minute BP sitting:   122 / 78  (right arm)  Vitals Entered By: Baxter Hire) (June 04, 2010 2:49 PM) CC: cyst on right peroneal tendon Pain Assessment Patient in pain? yes     Location: right foot Intensity: 4 Onset of pain  pain is worse when on feet all day Nutritional Status BMI of > 30 = obese  Does patient need assistance? Functional Status Self care Ambulation Normal   Primary Care Provider:  Neena Rhymes MD  CC:  cyst on right peroneal tendon.  History of Present Illness: 35 yo [redacted] week pregnant F here with right ankle pain.  Patient reports no known injury. States over past 2 weeks has had slowly worsening right ankle pain especially with walking No issues with left side Associated with focal swelling. Has tried icing and using icy hot.  No other medications Had an ankle sprain remotely but unsure which side. No numbness or any other complaints.  Habits & Providers  Alcohol-Tobacco-Diet     Alcohol drinks/day: 0     Tobacco Status: never  Problems Prior to Update: 1)  Ankle Pain  (ICD-719.47) 2)  Abortion, Spontaneous  (ICD-634.90) 3)  Sinusitis - Acute-nos  (ICD-461.9) 4)  Proteinuria  (ICD-791.0) 5)  Hyperlipidemia  (ICD-272.4) 6)  Dm  (ICD-250.00) 7)  Hyperglycemia, Mild  (ICD-790.29) 8)  Hypertriglyceridemia, Mild  (ICD-272.4) 9)  Physical Examination  (ICD-V70.0) 10)  Hypertension  (ICD-401.9) 11)  Rhinitis  (ICD-477.9) 12)  Asthma  (ICD-493.90)  Medications Prior to Update: 1)  Ventolin Hfa 108 (90 Base) Mcg/act Aers (Albuterol Sulfate) .... 2 Puffs Q4 As Needed For Cough or Wheezing 2)  Qvar 40 Mcg/act Aers (Beclomethasone Dipropionate) .... 2  Puffs Two Times A Day 3)  Labetalol Hcl 300 Mg Tabs (Labetalol Hcl) .Marland Kitchen.. 1 Tab By Mouth Two Times A Day. 4)  Nasonex 50 Mcg/act Susp (Mometasone Furoate) .... 2 Sprays Each Nostril Once Daily  Allergies (verified): No Known Drug Allergies  Family History: Reviewed history from 11/06/2008 and no changes required. CAD-no HTN-mother DM-no STROKE-mother COLON CA-no BREAST CA-maternal aunt 36's                      paternal aunt 66's  Social History: Reviewed history from 11/06/2008 and no changes required. married, from [place] daughter, Elonda Husky (2010)  Physical Exam  General:  Well-developed,well-nourished,in no acute distress; alert,appropriate and cooperative throughout examination Msk:  R foot/ankle: Fairly well preserved long arches, normal transverse arches. Normal post tib function but mild collapse on ambulation. Focal swelling about 1 cm posterior to lateral malleolus - moves with foot ext rotation/int rotation - not pulsatile. TTP throughout peroneal musculature past lat malleolus thru just proximal to insertion of brevis on base 5th.  No TTP plantar aspect of foot or elsewhere about foot/ankle. FROM but pain  reproduced with ext rotation - 3+/5 strength.  5/5 strength without pain other motions. Neg ant drawer, talar tilt. 2+ dp pulses. Additional Exam:  MSK u/s R ankle, limited: Peroneus brevis and longus tendons visualized and appear normal in long and transverse views -  no evidence of tears or increased neovascularity.  Approximately 1 cm posterior to posterior edge lat malleolus patient has at maximum a 1.39cm x 0.79cm area of swelling with tendon at center consistent with peroneus quartus.  No neovascularity within this but increased at superficial border of tendon sheath.  Visualized in trans and long views - appears intact without tear.  Images saved for documentation.   Impression & Recommendations:  Problem # 1:  ANKLE PAIN (ICD-719.47) Assessment  Unchanged  Consistent with peroneal tendinopathy.  Swelling appears to be tendon sheath swelling in peroneus quartus, a muscle/tendon present in about 7% of the population.  We discussed possibly draining this but I am concerned it would recur - also, her pain is throughout all peroneal muscles/tendons so would likely not alleviate her pain.  Would like to treat her main issue, the peroneal tendinopathy, though advised her if not improving after 4-6 weeks and swelling is bothering her, would consider trying aspiration at that time.  Start physical therapy without ionto, comforthotics with small scaphoid pads to control her mild overpronation going from sitting to standing - given extra set of scaphoid pads for those shoes these do not fit into.  Avoid nsaids given she is pregnant.  Tylenol and icing as well.  Try ACE wrap from foot proximal to help with swelling, elevation.  F/u in 4-6 weeks for recheck and call with any questions in meantime.  Orders: [place] LIMITED ([id number])  Complete Medication List: 1)  Ventolin Hfa 108 (90 Base) Mcg/act Aers (Albuterol sulfate) .... 2 puffs q4 as needed for cough or wheezing 2)  Qvar 40 Mcg/act Aers (Beclomethasone dipropionate) .... 2 puffs two times a day 3)  Labetalol Hcl 300 Mg Tabs (Labetalol hcl) .Marland Kitchen.. 1 tab by mouth two times a day. 4)  Nasonex 50 Mcg/act Susp (Mometasone furoate) .... 2 sprays each nostril once daily  Patient Instructions: 1)  Your swelling appears to be in the tendon sheath of one of the tendons around your ankle and not a localized cyst. 2)  Your pain is related to peroneal tendinopathy with the swelling secondary to this condition. 3)  This is treated with physical therapy and control of foot motion with inserts and arch pads. 4)  If not improving with this after 4-6 weeks, we can try to pull the fluid out. 5)  Take tylenol as needed for pain - NO advil, aleve given you are pregnant. 6)  Ice area often 10-15 minutes at a time. 7)   Elevate above the level of your heart to help with swelling. 8)  Follow up with me in 4 weeks for a recheck on your progress.   Orders Added: 1)  New Patient Level III [99203] 2)  [place] LIMITED [[id number]]

## 2010-06-13 ENCOUNTER — Ambulatory Visit: Payer: 59 | Attending: Family Medicine | Admitting: Physical Therapy

## 2010-06-13 DIAGNOSIS — M25676 Stiffness of unspecified foot, not elsewhere classified: Secondary | ICD-10-CM | POA: Insufficient documentation

## 2010-06-13 DIAGNOSIS — R262 Difficulty in walking, not elsewhere classified: Secondary | ICD-10-CM | POA: Insufficient documentation

## 2010-06-13 DIAGNOSIS — IMO0001 Reserved for inherently not codable concepts without codable children: Secondary | ICD-10-CM | POA: Insufficient documentation

## 2010-06-13 DIAGNOSIS — M25673 Stiffness of unspecified ankle, not elsewhere classified: Secondary | ICD-10-CM | POA: Insufficient documentation

## 2010-06-13 DIAGNOSIS — M25579 Pain in unspecified ankle and joints of unspecified foot: Secondary | ICD-10-CM | POA: Insufficient documentation

## 2010-06-17 ENCOUNTER — Ambulatory Visit: Payer: 59

## 2010-06-20 ENCOUNTER — Ambulatory Visit: Payer: 59 | Admitting: Rehabilitation

## 2010-06-24 LAB — HEPATITIS B SURFACE ANTIGEN: Hepatitis B Surface Ag: NEGATIVE

## 2010-06-24 LAB — RPR: RPR: NONREACTIVE

## 2010-06-27 ENCOUNTER — Ambulatory Visit: Payer: 59 | Admitting: Physical Therapy

## 2010-06-28 ENCOUNTER — Ambulatory Visit: Payer: 59 | Admitting: Physical Therapy

## 2010-07-01 ENCOUNTER — Ambulatory Visit: Payer: 59 | Admitting: Physical Therapy

## 2010-07-04 ENCOUNTER — Ambulatory Visit: Payer: 59 | Admitting: Family Medicine

## 2010-07-04 ENCOUNTER — Ambulatory Visit: Payer: 59 | Admitting: Physical Therapy

## 2010-07-08 ENCOUNTER — Ambulatory Visit (INDEPENDENT_AMBULATORY_CARE_PROVIDER_SITE_OTHER): Payer: 59 | Admitting: Family Medicine

## 2010-07-08 ENCOUNTER — Encounter: Payer: Self-pay | Admitting: Family Medicine

## 2010-07-08 VITALS — BP 124/80 | Temp 97.0°F | Ht 61.5 in | Wt 186.0 lb

## 2010-07-08 DIAGNOSIS — J45909 Unspecified asthma, uncomplicated: Secondary | ICD-10-CM

## 2010-07-08 DIAGNOSIS — J309 Allergic rhinitis, unspecified: Secondary | ICD-10-CM

## 2010-07-08 DIAGNOSIS — J019 Acute sinusitis, unspecified: Secondary | ICD-10-CM

## 2010-07-08 DIAGNOSIS — E119 Type 2 diabetes mellitus without complications: Secondary | ICD-10-CM

## 2010-07-08 DIAGNOSIS — I10 Essential (primary) hypertension: Secondary | ICD-10-CM

## 2010-07-08 LAB — POCT RAPID STREP A (OFFICE): Streptococcus, Group A Screen (Direct): POSITIVE — AB

## 2010-07-08 MED ORDER — AMOXICILLIN 875 MG PO TABS: 875.0000 mg | ORAL_TABLET | Freq: Two times a day (BID) | ORAL | Status: AC

## 2010-07-08 MED ORDER — BUDESONIDE 180 MCG/ACT IN AEPB: 1.0000 | INHALATION_SPRAY | Freq: Two times a day (BID) | RESPIRATORY_TRACT | Status: DC

## 2010-07-08 NOTE — Assessment & Plan Note (Signed)
Start amox for infxn.  Reviewed supportive care and red flags that should prompt return.  Pt expressed understanding and is in agreement w/ plan.

## 2010-07-08 NOTE — Assessment & Plan Note (Signed)
Pt's poorly controlled allergies are likely triggering asthma and sinus infxn.  claritin is category B in pregnancy- pt to start daily use.  Pt expressed understanding and is in agreement w/ plan.

## 2010-07-08 NOTE — Patient Instructions (Signed)
Follow up in 3-4 months (if OB wants [place] to follow your diabetes) You have a sinus infection- take the Amoxcillin as directed Start Claritin daily for your seasonal allergies Use the Pulmicort inhaler as a controller medication for your asthma Call with any questions or concerns Hang in there!

## 2010-07-08 NOTE — Assessment & Plan Note (Signed)
Pt's BP well controlled today.  Asymptomatic.

## 2010-07-08 NOTE — Assessment & Plan Note (Signed)
Check A1C today.  Pt reports that OB is following sugars closely.  Discussed option of OB treating this condition during pregnancy- thus eliminating need for our visits.  Pt to ask OB if she is comfortable w/ this.

## 2010-07-08 NOTE — Assessment & Plan Note (Signed)
Pt's asthma is poorly controlled- most likely b/c of seasonal allergies.  Switch from Qvar to Pulmicort b/c pulmicort is category B in pregnancy.  Continue albuterol prn.  Pt expressed understanding and is in agreement w/ plan.

## 2010-07-08 NOTE — Progress Notes (Signed)
  Subjective:    Patient ID: Amy Torres, female    DOB: 01-Sep-1975, 35 y.o.   MRN: [id number]  HPI DM- had CBG checked at GYN and 'it was good'.  OB is following sugars 'closely'.  Seasonal Allergies- currently not taking meds.  This is triggering asthma.  + nasal congestion, coughing and sneezing.  Asthma- using Albuterol inhaler q4.  Still feels as if she's wheezing.  + SOB.  HTN- BP well controlled on Labetalol.  No CP, edema, HAs, visual changes.   Review of Systems For ROS see HPI.    Objective:   Physical Exam  Constitutional: She appears well-developed and well-nourished. No distress.  HENT:  Head: Normocephalic and atraumatic.  Right Ear: Tympanic membrane normal.  Left Ear: Tympanic membrane normal.  Nose: Mucosal edema present. Right sinus exhibits maxillary sinus tenderness and frontal sinus tenderness. Left sinus exhibits maxillary sinus tenderness and frontal sinus tenderness.  Mouth/Throat: No oropharyngeal exudate, posterior oropharyngeal edema or posterior oropharyngeal erythema.  Eyes: Conjunctivae and EOM are normal. Pupils are equal, round, and reactive to light.  Neck: Normal range of motion. No thyromegaly present.  Cardiovascular: Normal rate, regular rhythm, normal heart sounds and intact distal pulses.   No murmur heard. Pulmonary/Chest: Effort normal and breath sounds normal. No respiratory distress. She has no wheezes.  Musculoskeletal: She exhibits no edema.          Assessment & Plan:

## 2010-07-16 ENCOUNTER — Other Ambulatory Visit (HOSPITAL_COMMUNITY): Payer: Self-pay | Admitting: Obstetrics and Gynecology

## 2010-07-16 DIAGNOSIS — Z0489 Encounter for examination and observation for other specified reasons: Secondary | ICD-10-CM

## 2010-07-16 DIAGNOSIS — O09529 Supervision of elderly multigravida, unspecified trimester: Secondary | ICD-10-CM

## 2010-07-16 DIAGNOSIS — IMO0002 Reserved for concepts with insufficient information to code with codable children: Secondary | ICD-10-CM

## 2010-07-29 LAB — URINALYSIS, ROUTINE W REFLEX MICROSCOPIC
Leukocytes, UA: NEGATIVE
Nitrite: NEGATIVE
Specific Gravity, Urine: >1.03 — ABNORMAL HIGH (ref 1.005–1.030)
Urobilinogen, UA: 0.2 mg/dL (ref 0.0–1.0)

## 2010-07-29 LAB — URINE MICROSCOPIC-ADD ON

## 2010-07-30 LAB — CBC
HCT: 31.7 % — ABNORMAL LOW (ref 36.0–46.0)
MCHC: 33.7 g/dL (ref 30.0–36.0)
MCV: 89.6 fL (ref 78.0–100.0)
Platelets: 241 10*3/uL (ref 150–400)
Platelets: 200 10*3/uL (ref 150–400)
RBC: 4.42 MIL/uL (ref 3.87–5.11)
RDW: 14.7 % (ref 11.5–15.5)

## 2010-07-30 LAB — RPR: RPR Ser Ql: NONREACTIVE

## 2010-08-02 ENCOUNTER — Other Ambulatory Visit: Payer: Self-pay | Admitting: *Deleted

## 2010-08-02 MED ORDER — LABETALOL HCL 300 MG PO TABS: 300.0000 mg | ORAL_TABLET | Freq: Two times a day (BID) | ORAL | Status: DC

## 2010-08-02 NOTE — Telephone Encounter (Signed)
Pt lmovm requesting refill of Labetalol 300mg .

## 2010-08-02 NOTE — Telephone Encounter (Signed)
Pt aware.

## 2010-08-19 ENCOUNTER — Ambulatory Visit (HOSPITAL_COMMUNITY)
Admission: RE | Admit: 2010-08-19 | Discharge: 2010-08-19 | Disposition: A | Discharge status: Home / Self Care | Payer: 59 | Source: Ambulatory Visit | Attending: Obstetrics and Gynecology | Admitting: Obstetrics and Gynecology

## 2010-08-19 DIAGNOSIS — O10019 Pre-existing essential hypertension complicating pregnancy, unspecified trimester: Secondary | ICD-10-CM | POA: Insufficient documentation

## 2010-08-19 DIAGNOSIS — O358XX0 Maternal care for other (suspected) fetal abnormality and damage, not applicable or unspecified: Secondary | ICD-10-CM | POA: Insufficient documentation

## 2010-08-19 DIAGNOSIS — Z1389 Encounter for screening for other disorder: Secondary | ICD-10-CM | POA: Insufficient documentation

## 2010-08-19 DIAGNOSIS — Z0489 Encounter for examination and observation for other specified reasons: Secondary | ICD-10-CM

## 2010-08-19 DIAGNOSIS — Z363 Encounter for antenatal screening for malformations: Secondary | ICD-10-CM | POA: Insufficient documentation

## 2010-08-19 DIAGNOSIS — IMO0002 Reserved for concepts with insufficient information to code with codable children: Secondary | ICD-10-CM

## 2010-08-19 DIAGNOSIS — O09529 Supervision of elderly multigravida, unspecified trimester: Secondary | ICD-10-CM

## 2010-08-20 ENCOUNTER — Ambulatory Visit (HOSPITAL_COMMUNITY): Payer: 59

## 2010-08-27 NOTE — Op Note (Signed)
NAME:  Amy Torres, Amy Torres               ACCOUNT NO.:  [id number]   MEDICAL RECORD NO.:  [id number]          PATIENT TYPE:  INP   LOCATION:  9109                          FACILITY:  [facility]   PHYSICIAN:  Sherron Monday, MD        DATE OF BIRTH:  08/28/1975   DATE OF PROCEDURE:  DATE OF DISCHARGE:                               OPERATIVE REPORT   PREOPERATIVE DIAGNOSES:  Intrauterine pregnancy at term, failure to  progress, arrest of descent, failed vacuum.   POSTOPERATIVE DIAGNOSES:  Intrauterine pregnancy at term, failure to  progress, arrest of descent, failed vacuum.   PROCEDURE:  Primary low transverse cesarean section.   ANESTHESIA:  Epidural.   SURGEON:  Sherron Monday, MD   COMPLICATIONS:  Hemabate secondary to uterine atony.  A dose of Hemabate  was given to the patient, which she responded to well.   PATHOLOGY:  None.   FINDINGS:  Viable female infant in 54 with Apgars of 7 at one minute  and 9 at five minutes and her weight of 7 pounds and 4 ounces.  The  placenta was expressed intact.  Normal uterus, tubes, and ovary.   ESTIMATED BLOOD LOSS:  600 mL.   IV FLUIDS:  2000 mL.   URINE OUTPUT:  125 mL, clear urine at the end of the procedure.   DISPOSITION:  Stable to PACU.   PROCEDURE:  After informed consent was reviewed, this patient including  risks, benefits, and alternatives of the surgical procedure, she was  transported to the operating room, placed on the table in supine  position with a leftward tilt and her epidural was redosed and found to  be adequate.  She was then prepped and draped in the normal sterile  fashion.  Secondary to her SEA FOOD allergy, she was prepped with  Hibiclens.  A Pfannenstiel skin incision was made above the level of her  crease from her pannus, carried through the underlying layer of fascia  sharply.  The fascia was incised in the midline and incision was  extended laterally with Mayo scissors.  The inferior aspect of fascial  incision was  grasped with Kocher clamps, elevated, and the rectus  muscles were dissected off both bluntly and sharply.  Attention was then  turned to the superior aspect of the fascial incision, was tented up in  a similar fashion, was grasped with Kocher clamps, elevated, and the  rectus muscles were dissected off both bluntly and sharply.  Midline was  easily identified and the peritoneum was entered bluntly.  Peritoneal  incision was extended after visualization of the bladder.  The Alexis  skin retractor was then placed.  The vesicouterine peritoneum was  identified, tented up, and a bladder flap was made both bluntly and  sharply.  The uterus was incised in transverse fashion.  Infant was  delivered from a vertex presentation without complication.  Nose and  mouth were suctioned on the field.  Cord was clamped and cut.  Infant  was handed off to the awaiting pediatric staff.  The placenta was  expressed from the uterus.  The  uterus was cleared of all clots and  debris.  Uterine incision was closed in 2 layers of 0 Monocryl.  The  first of which is a running locked and the second is an imbricating  layer.  Hemostasis was assured.  Copious irrigation was performed in the  pelvis.  The peritoneum was reapproximated using 2-0 Vicryl.  The fascia  was closed with 0 Vicryl in a running fashion.  The subcuticular adipose  layer was made hemostatic with Bovie cautery and irrigated.  The  subcuticular adipose tissue was approximated using 3-0 plain gut.  The  skin was closed with staples.  The patient tolerated the procedure well.  Sponge, lap, and needle counts were correct x2.      Sherron Monday, MD  Electronically Signed     JB/MEDQ  D:  05/29/2008  T:  05/30/2008  Job:  [id number]

## 2010-08-27 NOTE — Discharge Summary (Signed)
Amy Torres, Amy Torres               ACCOUNT NO.:  [id number]   MEDICAL RECORD NO.:  [id number]          PATIENT TYPE:  INP   LOCATION:  9109                          FACILITY:  [facility]   PHYSICIAN:  Sherron Monday, MD        DATE OF BIRTH:  1976/01/13   DATE OF ADMISSION:  05/28/2008  DATE OF DISCHARGE:  06/01/2008                               DISCHARGE SUMMARY   ADMITTING DIAGNOSES:  1. Intrauterine pregnancy at term.  2. Spontaneous rupture of membranes.   DISCHARGE DIAGNOSES:  1. Intrauterine pregnancy at term.  2. Spontaneous rupture of membranes.  3. Delivered via low transverse cesarean section secondary to arrest      of descent.   PROCEDURES:  Primary low transverse cesarean section.   HISTORY OF PRESENT ILLNESS:  A 35 year old G1 P0 with an EDC of June 18, 2008, admitted with spontaneous rupture of membranes for clear fluid at  home. She has had good fetal movement, no vaginal bleeding and  occasional contractions. Her prenatal care was uncomplicated.   PAST MEDICAL HISTORY:  Significant for high blood pressure and asthma.   PAST SURGICAL HISTORY:  Significant for benign lumpectomy.   PAST OB-GYN HISTORY:  G1, present pregnancy.  No history of any abnormal  Pap smear or sexually transmitted diseases.   MEDICATIONS:  Prenatal vitamins, Zantac, and Tylenol.   ALLERGIES:  No known drug allergies.   SOCIAL HISTORY:  Denies alcohol, tobacco, or drug use.  She is married.   FAMILY HISTORY:  Significant for mother with hypertension.   PHYSICAL EXAMINATION:  On admission, afebrile.  Vital signs stable and a  benign exam.   EMERGENCY DEPARTMENT COURSE:  She was admitted and progressed slowly in  labor, was given an epidural for comfort.  An IUPC was placed secondary  to slow cervical change and help augment her Pitocin.  In her  intrapartum course, she developed a fever.  She was started on  gentamicin because of the fever in addition to her penicillin that she  has been  previously receiving for GBS prophylaxis.  The patient dilated  to complete, to complete +1 at 5 o'clock p.m., pushed for approximately  an hour and a half.  At this time, the patient complained of exhaustion  with minimal progress.  I discussed with the patient risks, benefits,  and alternatives, vacuum extraction.  Bladder was drained.  Catheter was  sterilely removed.  The patient was draped and prepped for a spontaneous  vaginal delivery with 4 pulls and no pop-offs.  No progress was made  with a Kiwi vacuum.  Discussed with the patient risks, benefits, and  alternatives of low transverse cesarean section including bleeding,  infection, and damage to its surrounding organs, bowel or bladder, blood  vessels, and uterus, and also trouble healing.  The patient and the  father of the baby voiced understanding and wished to proceed.  Throughout the course of labor, baby has largely been in the 140s and  150s and reactive.   Prior to pursuing with the cesarean section, a small sulcal and  periurethral laceration  was repaired with 3-0 Vicryl in a typical  fashion.  We performed a cesarean section.  EBL of 600 mL.  IV fluids  2000 mL.  Urine output 125 mL.  Delivering a viable female infant at  26 on May 29, 2008, with Apgars of 7 at 1 minute and 9 at 5  minutes, and a weight of 7 pounds 4 ounces, and her postpartum course  was relatively uncomplicated.  We continued Ceftin for 24 hours for  chorioamnionitis.  She was doing well on postpartum day #3 with no  complaints.  She  tolerated p.o., ambulating well, and normal lochia.  Pain was well controlled.  Passing gas.  She was afebrile and vital  signs stable.   Her hemoglobin had decreased from 13.4 to 10.9.  At this time, her  staples were removed and Steri-Strips were applied.  Her incision was  clean, dry, and intact.  She is discharged to home with routine  discharge instructions as well as numbers to call with any questions or   problems, and prescriptions for Motrin, Vicodin, and prenatal vitamins.  She will follow up in the office in approximately 2 weeks.  She voiced  understanding to all this and was discharged to home.   PRENATAL LABORATORY DATA:  O positive, antibody screen  negative, RPR  nonreactive, rubella immune, hepatitis B surface antigen negative, HIV  negative, and gonorrhea and Chlamydia negative.  First trimester screen  within normal limits.  Group B-strep was positive.  AFB were within  normal limits. Glucola of 147.  A 3-hour GGT was not in the chart.  She  had dates by last menstrual period consistent with an ultrasound, and  ultrasound performed at 19 weeks was consistent with the dates and had  limited anatomy.  A followup ultrasound completed the anatomy.  She is O  positive and rubella immune.  She plans to breast-feed.  Her hemoglobin  decreased from 13.4 to 10.9, and we will discuss contraception at her  postpartum checkup.      Sherron Monday, MD  Electronically Signed     JB/MEDQ  D:  06/01/2008  T:  06/01/2008  Job:  [id number]

## 2010-12-20 ENCOUNTER — Inpatient Hospital Stay (HOSPITAL_COMMUNITY)
Admission: AD | Admit: 2010-12-20 | Discharge: 2010-12-20 | Disposition: A | Discharge status: Home / Self Care | Payer: 59 | Source: Ambulatory Visit | Attending: Obstetrics and Gynecology | Admitting: Obstetrics and Gynecology

## 2010-12-20 ENCOUNTER — Encounter (HOSPITAL_COMMUNITY): Payer: Self-pay | Admitting: *Deleted

## 2010-12-20 DIAGNOSIS — O4700 False labor before 37 completed weeks of gestation, unspecified trimester: Secondary | ICD-10-CM | POA: Insufficient documentation

## 2010-12-20 DIAGNOSIS — O99891 Other specified diseases and conditions complicating pregnancy: Secondary | ICD-10-CM | POA: Insufficient documentation

## 2010-12-20 DIAGNOSIS — O479 False labor, unspecified: Secondary | ICD-10-CM

## 2010-12-20 DIAGNOSIS — J45909 Unspecified asthma, uncomplicated: Secondary | ICD-10-CM | POA: Insufficient documentation

## 2010-12-20 DIAGNOSIS — O169 Unspecified maternal hypertension, unspecified trimester: Secondary | ICD-10-CM | POA: Insufficient documentation

## 2010-12-20 DIAGNOSIS — O99810 Abnormal glucose complicating pregnancy: Secondary | ICD-10-CM | POA: Insufficient documentation

## 2010-12-20 LAB — URINALYSIS, ROUTINE W REFLEX MICROSCOPIC
Bilirubin Urine: NEGATIVE
Ketones, ur: NEGATIVE mg/dL
Nitrite: NEGATIVE
Specific Gravity, Urine: >1.03 — ABNORMAL HIGH (ref 1.005–1.030)
Urobilinogen, UA: 0.2 mg/dL (ref 0.0–1.0)

## 2010-12-20 LAB — URINE MICROSCOPIC-ADD ON

## 2010-12-20 NOTE — ED Provider Notes (Signed)
History     Chief Complaint  Patient presents with  . Contractions   HPI  Pt is a G3P1011 at 35.5 wks here for contractions that started at 0400 today.  Denies vaginal bleeding or leaking of fluid.  Past Medical History  Diagnosis Date  . Asthma   . Hypertension   . Miscarriage     12/11  . Diabetes mellitus     borderline gestational    Past Surgical History  Procedure Date  . Breast cyst excision     left breast  . Cesarean section     Family History  Problem Relation Age of Onset  . Hypertension Mother   . Stroke Mother   . Cancer Maternal Aunt     breast cancer  . Cancer Paternal Aunt     breast cancer    History  Substance Use Topics  . Smoking status: Never Smoker   . Smokeless tobacco: Not on file  . Alcohol Use: No    Allergies: No Known Allergies  Prescriptions prior to admission  Medication Sig Dispense Refill  . acetaminophen (TYLENOL) 500 MG tablet Take 500 mg by mouth every 6 (six) hours as needed. Patient takes medication for pain.       Marland Kitchen labetalol (NORMODYNE) 300 MG tablet Take 300 mg by mouth 2 (two) times daily.        Marland Kitchen omeprazole (PRILOSEC) 40 MG capsule Take 40 mg by mouth daily.        . ondansetron (ZOFRAN) 8 MG tablet Take 8 mg by mouth every 8 (eight) hours as needed.        Marland Kitchen albuterol (VENTOLIN HFA) 108 (90 BASE) MCG/ACT inhaler Inhale 2 puffs into the lungs every 4 (four) hours as needed.        . budesonide (PULMICORT FLEXHALER) 180 MCG/ACT inhaler Inhale 1 puff into the lungs 2 (two) times daily.  1 each  5  . labetalol (NORMODYNE) 300 MG tablet Take 1 tablet (300 mg total) by mouth 2 (two) times daily.  60 tablet  3  . mometasone (NASONEX) 50 MCG/ACT nasal spray 2 sprays by Nasal route daily.          Review of Systems  Gastrointestinal: Positive for abdominal pain.  All other systems reviewed and are negative.   Physical Exam   Blood pressure 130/73, pulse 85, temperature 98 F (36.7 C), temperature source Oral, resp.  rate 18, height 5\' 2"  (1.575 m), weight 92.987 kg (205 lb).  Physical Exam  Constitutional: She is oriented to person, place, and time. She appears well-developed and well-nourished.  HENT:  Head: Normocephalic.  Neck: Normal range of motion. Neck supple.  Cardiovascular: Normal rate, regular rhythm and normal heart sounds.   Respiratory: Effort normal and breath sounds normal.  GI: Soft.  Genitourinary: No bleeding around the vagina. Vaginal discharge (mucusy) found.       Cervix closed/thick/high  Neurological: She is alert and oriented to person, place, and time.  Skin: Skin is warm and dry.    MAU Course  Procedures    Assessment and Plan  False labor  DC to home Labor precautions  [facility] 12/20/2010, 4:59 PM

## 2010-12-20 NOTE — Progress Notes (Signed)
FHR 130's, +accels Cat I Toco - irregular

## 2010-12-20 NOTE — Progress Notes (Signed)
Pt states she called the office and was sent over to mau due to contractions.  States contractions started this morning and have no stopped.  Denies any bleeding or leaking of fluid.  + FM.

## 2010-12-24 ENCOUNTER — Other Ambulatory Visit: Payer: Self-pay | Admitting: Obstetrics and Gynecology

## 2010-12-31 ENCOUNTER — Inpatient Hospital Stay (HOSPITAL_COMMUNITY)
Admission: AD | Admit: 2010-12-31 | Discharge: 2011-01-05 | DRG: 766 | Disposition: A | Discharge status: Home / Self Care | Payer: 59 | Source: Ambulatory Visit | Attending: Obstetrics and Gynecology | Admitting: Obstetrics and Gynecology

## 2010-12-31 ENCOUNTER — Encounter (HOSPITAL_COMMUNITY): Payer: Self-pay | Admitting: *Deleted

## 2010-12-31 ENCOUNTER — Other Ambulatory Visit: Payer: Self-pay | Admitting: Obstetrics and Gynecology

## 2010-12-31 DIAGNOSIS — O1400 Mild to moderate pre-eclampsia, unspecified trimester: Secondary | ICD-10-CM

## 2010-12-31 DIAGNOSIS — IMO0002 Reserved for concepts with insufficient information to code with codable children: Principal | ICD-10-CM | POA: Diagnosis present

## 2010-12-31 DIAGNOSIS — I10 Essential (primary) hypertension: Secondary | ICD-10-CM | POA: Diagnosis present

## 2010-12-31 DIAGNOSIS — Z302 Encounter for sterilization: Secondary | ICD-10-CM

## 2010-12-31 DIAGNOSIS — O99892 Other specified diseases and conditions complicating childbirth: Secondary | ICD-10-CM | POA: Diagnosis present

## 2010-12-31 DIAGNOSIS — O99814 Abnormal glucose complicating childbirth: Secondary | ICD-10-CM | POA: Diagnosis present

## 2010-12-31 DIAGNOSIS — Z22330 Carrier of Group B streptococcus: Secondary | ICD-10-CM

## 2010-12-31 DIAGNOSIS — O34219 Maternal care for unspecified type scar from previous cesarean delivery: Secondary | ICD-10-CM

## 2010-12-31 DIAGNOSIS — O09529 Supervision of elderly multigravida, unspecified trimester: Secondary | ICD-10-CM | POA: Diagnosis present

## 2010-12-31 DIAGNOSIS — O2690 Pregnancy related conditions, unspecified, unspecified trimester: Secondary | ICD-10-CM

## 2010-12-31 HISTORY — DX: Pregnancy related conditions, unspecified, unspecified trimester: O26.90

## 2010-12-31 LAB — CBC
MCH: 29.5 pg (ref 26.0–34.0)
MCHC: 33.5 g/dL (ref 30.0–36.0)
MCV: 88.1 fL (ref 78.0–100.0)
Platelets: 205 10*3/uL (ref 150–400)
RDW: 13.9 % (ref 11.5–15.5)

## 2010-12-31 MED ORDER — LACTATED RINGERS IV SOLN: INTRAVENOUS | Status: DC

## 2010-12-31 MED ORDER — LACTATED RINGERS IV SOLN
INTRAVENOUS | Status: DC
  Administered 2010-12-31 – 2011-01-01 (×4): via INTRAVENOUS

## 2010-12-31 MED ORDER — ONDANSETRON HCL 4 MG PO TABS: 8.0000 mg | ORAL_TABLET | Freq: Three times a day (TID) | ORAL | Status: DC | PRN

## 2010-12-31 MED ORDER — CEFAZOLIN SODIUM-DEXTROSE 2-3 GM-% IV SOLR
2.0000 g | INTRAVENOUS | Status: DC
  Filled 2010-12-31: qty 50

## 2010-12-31 MED ORDER — PANTOPRAZOLE SODIUM 40 MG PO TBEC
40.0000 mg | DELAYED_RELEASE_TABLET | Freq: Every day | ORAL | Status: DC
  Administered 2010-12-31: 40 mg via ORAL
  Filled 2010-12-31 (×3): qty 1

## 2010-12-31 MED ORDER — DOCUSATE SODIUM 100 MG PO CAPS
100.0000 mg | ORAL_CAPSULE | Freq: Every day | ORAL | Status: DC
  Administered 2010-12-31: 100 mg via ORAL
  Filled 2010-12-31: qty 1

## 2010-12-31 MED ORDER — CALCIUM CARBONATE ANTACID 500 MG PO CHEW: 2.0000 | CHEWABLE_TABLET | ORAL | Status: DC | PRN

## 2010-12-31 MED ORDER — PRENATAL PLUS 27-1 MG PO TABS
1.0000 | ORAL_TABLET | Freq: Every day | ORAL | Status: DC
Start: 2010-12-31 — End: 2011-01-01
  Administered 2010-12-31: 1 via ORAL
  Filled 2010-12-31: qty 1

## 2010-12-31 MED ORDER — DEXTROSE 5 % IV SOLN: 2.0000 g | INTRAVENOUS | Status: DC

## 2010-12-31 MED ORDER — LABETALOL HCL 300 MG PO TABS
300.0000 mg | ORAL_TABLET | Freq: Two times a day (BID) | ORAL | Status: DC
  Administered 2010-12-31 – 2011-01-01 (×2): 300 mg via ORAL
  Filled 2010-12-31 (×4): qty 1

## 2010-12-31 MED ORDER — ZOLPIDEM TARTRATE 10 MG PO TABS: 10.0000 mg | ORAL_TABLET | Freq: Every evening | ORAL | Status: DC | PRN

## 2010-12-31 MED ORDER — ACETAMINOPHEN 325 MG PO TABS: 650.0000 mg | ORAL_TABLET | ORAL | Status: DC | PRN

## 2010-12-31 NOTE — H&P (Signed)
JARIANA SHUMARD is a 35 y.o. female G3P1011 at 38+ with Preeclampsia presenting for close monitoring overnight and rLTCS in AM. Pt with Preeclampsia.  D/w pt r/b/a of LTCS and BTL wishes to proceed.  Preg complicated by AMA, NVP, CHTN, Insulin resistance and asthma.     Maternal Medical History:  Reason for admission: PreEclampsia with elevated BP in office 170/100 and 3+ proteinuria  Fetal activity: Perceived fetal activity is normal.    Prenatal complications: Hypertension.     OB History    Grav Para Term Preterm Abortions TAB SAB Ect Mult Living   3 1 1  0 1 0 1 0 0 1    G1 7#4, failed vaccuum, LTCS, GDM; G2 SAB G3 presGDM,  No abnormal pap, no STDs  Past Medical History  Diagnosis Date  . Asthma   . Hypertension   . Miscarriage     12/11  . Diabetes mellitus     borderline gestational  . Pregnancy, complicated 12/31/2010   Past Surgical History  Procedure Date  . Breast cyst excision     left breast  . Cesarean section    Family History: family history includes Cancer in her maternal aunt and paternal aunt; Hypertension in her mother; and Stroke in her mother. Social History:  reports that she has never smoked. She does not have any smokeless tobacco history on file. She reports that she does not drink alcohol or use illicit drugs. married, care taker  Meds: PNV All: NKDA, no latex allergy   Review of Systems  Constitutional: Negative.   Eyes: Negative.   Respiratory: Negative.   Cardiovascular: Negative.   Gastrointestinal: Negative.   Genitourinary: Negative.   Musculoskeletal: Negative.   Skin: Negative.   Neurological: Positive for headaches.  Psychiatric/Behavioral: Negative.       Blood pressure 138/64, pulse 72, temperature 98.2 F (36.8 C), temperature source Oral, resp. rate 18, height 5\' 2"  (1.575 m), weight 94.802 kg (209 lb), unknown if currently breastfeeding. Maternal Exam:  Abdomen: Surgical scars: low transverse.     Physical Exam    Constitutional: She is oriented to person, place, and time. She appears well-developed and well-nourished.  HENT:  Head: Normocephalic and atraumatic.  Neck: Normal range of motion. Neck supple.  Cardiovascular: Normal rate and regular rhythm.   Respiratory: Effort normal and breath sounds normal.  GI: Soft. Bowel sounds are normal. She exhibits no distension. There is no tenderness.  Musculoskeletal: Normal range of motion.  Neurological: She is alert and oriented to person, place, and time.  Psychiatric: She has a normal mood and affect.   Nl plts on admission  Prenatal labs: ABO, Rh: O/Positive/-- (03/12 0000) Antibody: Negative (03/12 0000) Rubella:  immune RPR: Nonreactive (03/12 0000)  HBsAg: Negative (03/12 0000)  HIV: Non-reactive (03/12 0000)  GBS: Positive (09/10 0000)  1st tri screen WNL, AFP WNL; glucola 150 - pt just checked sugars, Pap WNL - HR HPV neg; Hgb electro WNL, Plt 312K, GC neg, Chl neg,CF neg  Assessment/Plan: 35yo G3P1011 at 37+ with preeclampsia for rLTCS/BTL on 9/19, reviewed with pt risk of PreEclampsia,also rLTCS and BTL including but not limited to bleeding, infectiion, damage to surrounding organs, trouble healing. Also 1/200 risk of future pregnncy with increased risk of ectopic pregnancy.  Questions answered.  Will proceed, careful monitoring BP and symptoms until cesarean section.     BOVARD,Jazmene Racz 12/31/2010, 8:59 PM

## 2011-01-01 ENCOUNTER — Other Ambulatory Visit: Payer: Self-pay | Admitting: Obstetrics and Gynecology

## 2011-01-01 ENCOUNTER — Encounter (HOSPITAL_COMMUNITY)
Admission: AD | Disposition: A | Discharge status: Home / Self Care | Payer: Self-pay | Source: Ambulatory Visit | Attending: Obstetrics and Gynecology

## 2011-01-01 ENCOUNTER — Inpatient Hospital Stay (HOSPITAL_COMMUNITY): Payer: 59 | Admitting: Anesthesiology

## 2011-01-01 ENCOUNTER — Encounter (HOSPITAL_COMMUNITY): Payer: Self-pay | Admitting: Anesthesiology

## 2011-01-01 ENCOUNTER — Other Ambulatory Visit: Payer: Self-pay | Admitting: Family Medicine

## 2011-01-01 ENCOUNTER — Encounter (HOSPITAL_COMMUNITY): Payer: Self-pay | Admitting: *Deleted

## 2011-01-01 ENCOUNTER — Inpatient Hospital Stay (HOSPITAL_COMMUNITY): Admission: RE | Admit: 2011-01-01 | Payer: 59 | Source: Ambulatory Visit | Admitting: Obstetrics and Gynecology

## 2011-01-01 DIAGNOSIS — O34219 Maternal care for unspecified type scar from previous cesarean delivery: Secondary | ICD-10-CM

## 2011-01-01 LAB — URINALYSIS, ROUTINE W REFLEX MICROSCOPIC
Protein, ur: NEGATIVE mg/dL
Specific Gravity, Urine: <1.005 — ABNORMAL LOW (ref 1.005–1.030)
Urobilinogen, UA: 0.2 mg/dL (ref 0.0–1.0)

## 2011-01-01 LAB — COMPREHENSIVE METABOLIC PANEL
ALT: 10 U/L (ref 0–35)
AST: 15 U/L (ref 0–37)
Albumin: 2.3 g/dL — ABNORMAL LOW (ref 3.5–5.2)
CO2: 26 mEq/L (ref 19–32)
Calcium: 8.8 mg/dL (ref 8.4–10.5)
Chloride: 103 mEq/L (ref 96–112)
GFR calc non Af Amer: >60 mL/min (ref >60)
Sodium: 136 mEq/L (ref 135–145)
Total Bilirubin: 0.3 mg/dL (ref 0.3–1.2)

## 2011-01-01 LAB — URINE MICROSCOPIC-ADD ON

## 2011-01-01 LAB — GLUCOSE, CAPILLARY
Glucose-Capillary: 77 mg/dL (ref 70–99)
Glucose-Capillary: 82 mg/dL (ref 70–99)

## 2011-01-01 LAB — CBC
Platelets: 192 10*3/uL (ref 150–400)
RBC: 3.88 MIL/uL (ref 3.87–5.11)
RDW: 14.1 % (ref 11.5–15.5)
WBC: 8.6 10*3/uL (ref 4.0–10.5)

## 2011-01-01 SURGERY — Surgical Case
Anesthesia: Spinal | Site: Abdomen | Laterality: Bilateral | Wound class: Clean Contaminated

## 2011-01-01 MED ORDER — NALBUPHINE HCL 10 MG/ML IJ SOLN: 5.0000 mg | INTRAMUSCULAR | Status: DC | PRN

## 2011-01-01 MED ORDER — DIPHENHYDRAMINE HCL 50 MG/ML IJ SOLN
12.5000 mg | INTRAMUSCULAR | Status: DC | PRN
  Administered 2011-01-01: 12.5 mg via INTRAVENOUS
  Filled 2011-01-01: qty 1

## 2011-01-01 MED ORDER — OXYTOCIN 20 UNITS IN LACTATED RINGERS INFUSION - SIMPLE
INTRAVENOUS | Status: AC
  Administered 2011-01-01: 125 mL/h via INTRAVENOUS
  Filled 2011-01-01: qty 1000

## 2011-01-01 MED ORDER — DIBUCAINE 1 % RE OINT: 1.0000 "application " | TOPICAL_OINTMENT | RECTAL | Status: DC | PRN

## 2011-01-01 MED ORDER — CEFAZOLIN SODIUM 1-5 GM-% IV SOLN
INTRAVENOUS | Status: DC | PRN
  Administered 2011-01-01: 2 g via INTRAVENOUS

## 2011-01-01 MED ORDER — ONDANSETRON HCL 4 MG/2ML IJ SOLN
INTRAMUSCULAR | Status: AC
  Filled 2011-01-01: qty 2

## 2011-01-01 MED ORDER — EPHEDRINE 5 MG/ML INJ
INTRAVENOUS | Status: AC
  Filled 2011-01-01: qty 10

## 2011-01-01 MED ORDER — SODIUM CHLORIDE 0.9 % IJ SOLN: 3.0000 mL | INTRAMUSCULAR | Status: DC | PRN

## 2011-01-01 MED ORDER — ZOLPIDEM TARTRATE 5 MG PO TABS: 5.0000 mg | ORAL_TABLET | Freq: Every evening | ORAL | Status: DC | PRN

## 2011-01-01 MED ORDER — PHENYLEPHRINE HCL 10 MG/ML IJ SOLN
INTRAMUSCULAR | Status: DC | PRN
  Administered 2011-01-01: 40 ug via INTRAVENOUS
  Administered 2011-01-01: 80 ug via INTRAVENOUS
  Administered 2011-01-01 (×4): 40 ug via INTRAVENOUS
  Administered 2011-01-01: 80 ug via INTRAVENOUS
  Administered 2011-01-01: 40 ug via INTRAVENOUS

## 2011-01-01 MED ORDER — OXYTOCIN 10 UNIT/ML IJ SOLN
INTRAMUSCULAR | Status: AC
  Filled 2011-01-01: qty 4

## 2011-01-01 MED ORDER — MENTHOL 3 MG MT LOZG: 1.0000 | LOZENGE | OROMUCOSAL | Status: DC | PRN

## 2011-01-01 MED ORDER — TETANUS-DIPHTH-ACELL PERTUSSIS 5-2.5-18.5 LF-MCG/0.5 IM SUSP
0.5000 mL | Freq: Once | INTRAMUSCULAR | Status: AC
  Administered 2011-01-02: 0.5 mL via INTRAMUSCULAR
  Filled 2011-01-01: qty 0.5

## 2011-01-01 MED ORDER — ONDANSETRON HCL 4 MG/2ML IJ SOLN
4.0000 mg | INTRAMUSCULAR | Status: DC | PRN
  Administered 2011-01-01: 4 mg via INTRAVENOUS
  Filled 2011-01-01: qty 2

## 2011-01-01 MED ORDER — PROMETHAZINE HCL 25 MG/ML IJ SOLN: 6.2500 mg | INTRAMUSCULAR | Status: DC | PRN

## 2011-01-01 MED ORDER — LANOLIN HYDROUS EX OINT: 1.0000 "application " | TOPICAL_OINTMENT | CUTANEOUS | Status: DC | PRN

## 2011-01-01 MED ORDER — BUPIVACAINE IN DEXTROSE 0.75-8.25 % IT SOLN
INTRATHECAL | Status: DC | PRN
  Administered 2011-01-01: 11 mg via INTRATHECAL

## 2011-01-01 MED ORDER — KETOROLAC TROMETHAMINE 30 MG/ML IJ SOLN: 30.0000 mg | Freq: Four times a day (QID) | INTRAMUSCULAR | Status: AC | PRN

## 2011-01-01 MED ORDER — ACETAMINOPHEN 10 MG/ML IV SOLN: 1000.0000 mg | Freq: Four times a day (QID) | INTRAVENOUS | Status: AC | PRN

## 2011-01-01 MED ORDER — ONDANSETRON HCL 4 MG PO TABS: 4.0000 mg | ORAL_TABLET | ORAL | Status: DC | PRN

## 2011-01-01 MED ORDER — ONDANSETRON HCL 4 MG/2ML IJ SOLN
INTRAMUSCULAR | Status: DC | PRN
  Administered 2011-01-01: 4 mg via INTRAVENOUS

## 2011-01-01 MED ORDER — SIMETHICONE 80 MG PO CHEW
80.0000 mg | CHEWABLE_TABLET | Freq: Three times a day (TID) | ORAL | Status: DC
  Administered 2011-01-01 – 2011-01-05 (×13): 80 mg via ORAL

## 2011-01-01 MED ORDER — NALOXONE HCL 0.4 MG/ML IJ SOLN: 0.4000 mg | INTRAMUSCULAR | Status: DC | PRN

## 2011-01-01 MED ORDER — CITRIC ACID-SODIUM CITRATE 334-500 MG/5ML PO SOLN
ORAL | Status: AC
  Administered 2011-01-01: 30 mL via ORAL
  Filled 2011-01-01: qty 15

## 2011-01-01 MED ORDER — CITRIC ACID-SODIUM CITRATE 334-500 MG/5ML PO SOLN
30.0000 mL | Freq: Once | ORAL | Status: AC
  Administered 2011-01-01: 30 mL via ORAL

## 2011-01-01 MED ORDER — PHENYLEPHRINE 40 MCG/ML (10ML) SYRINGE FOR IV PUSH (FOR BLOOD PRESSURE SUPPORT)
PREFILLED_SYRINGE | INTRAVENOUS | Status: AC
  Filled 2011-01-01: qty 15

## 2011-01-01 MED ORDER — INFLUENZA VIRUS VACC SPLIT PF IM SUSP
0.5000 mL | Freq: Once | INTRAMUSCULAR | Status: AC
  Administered 2011-01-02: 0.5 mL via INTRAMUSCULAR
  Filled 2011-01-01: qty 0.5

## 2011-01-01 MED ORDER — OXYTOCIN 20 UNITS IN LACTATED RINGERS INFUSION - SIMPLE
125.0000 mL/h | INTRAVENOUS | Status: AC
  Administered 2011-01-01: 125 mL/h via INTRAVENOUS

## 2011-01-01 MED ORDER — MORPHINE SULFATE 0.5 MG/ML IJ SOLN
INTRAMUSCULAR | Status: AC
  Filled 2011-01-01: qty 10

## 2011-01-01 MED ORDER — LABETALOL HCL 300 MG PO TABS
300.0000 mg | ORAL_TABLET | Freq: Two times a day (BID) | ORAL | Status: DC
  Administered 2011-01-01 – 2011-01-05 (×8): 300 mg via ORAL
  Filled 2011-01-01 (×10): qty 1

## 2011-01-01 MED ORDER — DIPHENHYDRAMINE HCL 25 MG PO CAPS
25.0000 mg | ORAL_CAPSULE | Freq: Four times a day (QID) | ORAL | Status: DC | PRN
  Administered 2011-01-02 (×2): 25 mg via ORAL

## 2011-01-01 MED ORDER — EPHEDRINE SULFATE 50 MG/ML IJ SOLN
INTRAMUSCULAR | Status: DC | PRN
  Administered 2011-01-01 (×4): 10 mg via INTRAVENOUS
  Administered 2011-01-01: 5 mg via INTRAVENOUS
  Administered 2011-01-01 (×2): 10 mg via INTRAVENOUS
  Administered 2011-01-01: 5 mg via INTRAVENOUS
  Administered 2011-01-01 (×3): 10 mg via INTRAVENOUS
  Administered 2011-01-01 (×2): 5 mg via INTRAVENOUS
  Administered 2011-01-01: 10 mg via INTRAVENOUS
  Administered 2011-01-01: 15 mg via INTRAVENOUS

## 2011-01-01 MED ORDER — METOCLOPRAMIDE HCL 5 MG/ML IJ SOLN: 10.0000 mg | Freq: Three times a day (TID) | INTRAMUSCULAR | Status: DC | PRN

## 2011-01-01 MED ORDER — LACTATED RINGERS IV SOLN
INTRAVENOUS | Status: DC
  Administered 2011-01-01: 20:00:00 via INTRAVENOUS

## 2011-01-01 MED ORDER — DIPHENHYDRAMINE HCL 25 MG PO CAPS
25.0000 mg | ORAL_CAPSULE | ORAL | Status: DC | PRN
  Administered 2011-01-02: 25 mg via ORAL
  Filled 2011-01-01 (×3): qty 1

## 2011-01-01 MED ORDER — PRENATAL PLUS 27-1 MG PO TABS
1.0000 | ORAL_TABLET | Freq: Every day | ORAL | Status: DC
  Administered 2011-01-02 – 2011-01-05 (×4): 1 via ORAL
  Filled 2011-01-01 (×5): qty 1

## 2011-01-01 MED ORDER — SCOPOLAMINE 1 MG/3DAYS TD PT72: 1.0000 | MEDICATED_PATCH | Freq: Once | TRANSDERMAL | Status: DC

## 2011-01-01 MED ORDER — ACETAMINOPHEN 325 MG PO TABS: 325.0000 mg | ORAL_TABLET | ORAL | Status: DC | PRN

## 2011-01-01 MED ORDER — OXYTOCIN 20 UNITS IN LACTATED RINGERS INFUSION - SIMPLE
INTRAVENOUS | Status: DC | PRN
  Administered 2011-01-01: 20 [IU] via INTRAVENOUS

## 2011-01-01 MED ORDER — OXYCODONE-ACETAMINOPHEN 5-325 MG PO TABS
1.0000 | ORAL_TABLET | ORAL | Status: DC | PRN
  Administered 2011-01-02: 2 via ORAL
  Administered 2011-01-02 – 2011-01-03 (×5): 1 via ORAL
  Administered 2011-01-03 – 2011-01-05 (×10): 2 via ORAL
  Filled 2011-01-01: qty 1
  Filled 2011-01-01: qty 2
  Filled 2011-01-01: qty 1
  Filled 2011-01-01 (×3): qty 2
  Filled 2011-01-01: qty 1
  Filled 2011-01-01 (×2): qty 2
  Filled 2011-01-01: qty 1
  Filled 2011-01-01 (×5): qty 2
  Filled 2011-01-01: qty 1

## 2011-01-01 MED ORDER — IBUPROFEN 800 MG PO TABS
800.0000 mg | ORAL_TABLET | Freq: Three times a day (TID) | ORAL | Status: DC
  Administered 2011-01-01 – 2011-01-05 (×11): 800 mg via ORAL
  Filled 2011-01-01 (×12): qty 1

## 2011-01-01 MED ORDER — FENTANYL CITRATE 0.05 MG/ML IJ SOLN
INTRAMUSCULAR | Status: AC
  Administered 2011-01-01: 25 ug via INTRAVENOUS
  Filled 2011-01-01: qty 2

## 2011-01-01 MED ORDER — ONDANSETRON HCL 4 MG/2ML IJ SOLN: 4.0000 mg | Freq: Three times a day (TID) | INTRAMUSCULAR | Status: DC | PRN

## 2011-01-01 MED ORDER — ACETAMINOPHEN 325 MG PO TABS
650.0000 mg | ORAL_TABLET | Freq: Once | ORAL | Status: AC
  Administered 2011-01-01: 650 mg via ORAL
  Filled 2011-01-01: qty 2

## 2011-01-01 MED ORDER — MEPERIDINE HCL 25 MG/ML IJ SOLN: 6.2500 mg | INTRAMUSCULAR | Status: DC | PRN

## 2011-01-01 MED ORDER — MORPHINE SULFATE 4 MG/ML IJ SOLN: 2.0000 mg | Freq: Once | INTRAMUSCULAR | Status: DC

## 2011-01-01 MED ORDER — FENTANYL CITRATE 0.05 MG/ML IJ SOLN
INTRAMUSCULAR | Status: AC
  Filled 2011-01-01: qty 2

## 2011-01-01 MED ORDER — FENTANYL CITRATE 0.05 MG/ML IJ SOLN
INTRAMUSCULAR | Status: DC | PRN
  Administered 2011-01-01: 25 ug via INTRATHECAL

## 2011-01-01 MED ORDER — WITCH HAZEL-GLYCERIN EX PADS: 1.0000 "application " | MEDICATED_PAD | CUTANEOUS | Status: DC | PRN

## 2011-01-01 MED ORDER — SODIUM CHLORIDE 0.9 % IV SOLN: 1.0000 ug/kg/h | INTRAVENOUS | Status: DC | PRN

## 2011-01-01 MED ORDER — FENTANYL CITRATE 0.05 MG/ML IJ SOLN
25.0000 ug | INTRAMUSCULAR | Status: DC | PRN
  Administered 2011-01-01 (×2): 25 ug via INTRAVENOUS

## 2011-01-01 MED ORDER — SENNOSIDES-DOCUSATE SODIUM 8.6-50 MG PO TABS
2.0000 | ORAL_TABLET | Freq: Every day | ORAL | Status: DC
  Administered 2011-01-01 – 2011-01-02 (×3): 2 via ORAL

## 2011-01-01 MED ORDER — DIPHENHYDRAMINE HCL 50 MG/ML IJ SOLN: 25.0000 mg | INTRAMUSCULAR | Status: DC | PRN

## 2011-01-01 MED ORDER — SODIUM CHLORIDE 0.9 % IR SOLN
Status: DC | PRN
  Administered 2011-01-01: 1000 mL

## 2011-01-01 MED ORDER — MORPHINE SULFATE (PF) 0.5 MG/ML IJ SOLN
INTRAMUSCULAR | Status: DC | PRN
  Administered 2011-01-01: .1 mg via INTRATHECAL

## 2011-01-01 MED ORDER — IBUPROFEN 600 MG PO TABS: 600.0000 mg | ORAL_TABLET | Freq: Four times a day (QID) | ORAL | Status: DC | PRN

## 2011-01-01 MED ORDER — KETOROLAC TROMETHAMINE 30 MG/ML IJ SOLN
INTRAMUSCULAR | Status: AC
  Administered 2011-01-01: 30 mg via INTRAVENOUS
  Filled 2011-01-01: qty 1

## 2011-01-01 MED ORDER — KETOROLAC TROMETHAMINE 30 MG/ML IJ SOLN
15.0000 mg | Freq: Once | INTRAMUSCULAR | Status: AC | PRN
  Administered 2011-01-01: 30 mg via INTRAVENOUS

## 2011-01-01 MED ORDER — SIMETHICONE 80 MG PO CHEW: 80.0000 mg | CHEWABLE_TABLET | ORAL | Status: DC | PRN

## 2011-01-01 SURGICAL SUPPLY — 31 items
CHLORAPREP W/TINT 26ML (MISCELLANEOUS) ×2 IMPLANT
CLOTH BEACON ORANGE TIMEOUT ST (SAFETY) ×2 IMPLANT
CONTAINER PREFILL 10% NBF 15ML (MISCELLANEOUS) IMPLANT
DRSG COVADERM 4X8 (GAUZE/BANDAGES/DRESSINGS) ×2 IMPLANT
ELECT REM PT RETURN 9FT ADLT (ELECTROSURGICAL) ×2
ELECTRODE REM PT RTRN 9FT ADLT (ELECTROSURGICAL) ×1 IMPLANT
EXTRACTOR VACUUM BELL STYLE (SUCTIONS) ×2 IMPLANT
EXTRACTOR VACUUM KIWI (MISCELLANEOUS) ×2 IMPLANT
EXTRACTOR VACUUM M CUP 4 TUBE (SUCTIONS) ×4 IMPLANT
GLOVE BIO SURGEON STRL SZ 6.5 (GLOVE) ×2 IMPLANT
GLOVE BIO SURGEON STRL SZ7 (GLOVE) ×2 IMPLANT
GOWN PREVENTION PLUS LG XLONG (DISPOSABLE) ×6 IMPLANT
KIT ABG SYR 3ML LUER SLIP (SYRINGE) ×2 IMPLANT
NEEDLE HYPO 25X5/8 SAFETYGLIDE (NEEDLE) IMPLANT
NS IRRIG 1000ML POUR BTL (IV SOLUTION) ×2 IMPLANT
PACK C SECTION WH (CUSTOM PROCEDURE TRAY) ×2 IMPLANT
RTRCTR C-SECT PINK 25CM LRG (MISCELLANEOUS) ×2 IMPLANT
SLEEVE SCD COMPRESS KNEE MED (MISCELLANEOUS) IMPLANT
STAPLER VISISTAT 35W (STAPLE) IMPLANT
SUT MNCRL 0 VIOLET CTX 36 (SUTURE) ×2 IMPLANT
SUT MONOCRYL 0 CTX 36 (SUTURE) ×2
SUT PLAIN 1 NONE 54 (SUTURE) ×2 IMPLANT
SUT PLAIN 2 0 XLH (SUTURE) ×2 IMPLANT
SUT VIC AB 0 CT1 27 (SUTURE) ×2
SUT VIC AB 0 CT1 27XBRD ANBCTR (SUTURE) ×2 IMPLANT
SUT VIC AB 2-0 CT1 27 (SUTURE) ×1
SUT VIC AB 2-0 CT1 TAPERPNT 27 (SUTURE) ×1 IMPLANT
SYR BULB IRRIGATION 50ML (SYRINGE) ×2 IMPLANT
TOWEL OR 17X24 6PK STRL BLUE (TOWEL DISPOSABLE) ×4 IMPLANT
TRAY FOLEY CATH 14FR (SET/KITS/TRAYS/PACK) ×2 IMPLANT
WATER STERILE IRR 1000ML POUR (IV SOLUTION) ×2 IMPLANT

## 2011-01-01 NOTE — Anesthesia Preprocedure Evaluation (Signed)
Anesthesia Evaluation  Name, MR# and DOB Patient awake  General Assessment Comment  Reviewed: Allergy & Precautions, H&P , Patient's Chart, lab work & pertinent test results  Airway Mallampati: III TM Distance: >3 FB Neck ROM: full    Dental No notable dental hx.    Pulmonary  asthma  clear to auscultation  pulmonary exam normalPulmonary Exam Normal breath sounds clear to auscultation none    Cardiovascular hypertension, regular Normal    Neuro/Psych Negative Neurological ROS  Negative Psych ROS  GI/Hepatic/Renal negative GI ROS  negative Liver ROS  negative Renal ROS        Endo/Other  Negative Endocrine ROS (+) Diabetes mellitus-,  Morbid obesity  Abdominal   Musculoskeletal   Hematology negative hematology ROS (+)   Peds  Reproductive/Obstetrics (+) Pregnancy    Anesthesia Other Findings             Anesthesia Physical Anesthesia Plan  ASA: III  Anesthesia Plan: Spinal   Post-op Pain Management:    Induction:   Airway Management Planned:   Additional Equipment:   Intra-op Plan:   Post-operative Plan:   Informed Consent: I have reviewed the patients History and Physical, chart, labs and discussed the procedure including the risks, benefits and alternatives for the proposed anesthesia with the patient or authorized representative who has indicated his/her understanding and acceptance.     Plan Discussed with:   Anesthesia Plan Comments:         Anesthesia Quick Evaluation

## 2011-01-01 NOTE — Progress Notes (Signed)
01/01/11 D/w pt r/b/a of rLTCS/ BTL at 12:30 - given 3+ proteinuria/elevated BP - mild preeclampsia. D/W pt surgery.   +FM, no LOF, no VB, occ ctx.  Pt c/o HA No RUQ pain, no scotomata  AFVSS 130-155/70-80's Good uop gen NAD Abd soft, FNT FHTs 135 reactive toco irr, occ  35yo G3P1011 at 37+ with mild preeclampsia for delivery today.  rLTCS/BTL Ancef 2 g OCTOR SCDs  J BOVARD

## 2011-01-01 NOTE — Anesthesia Postprocedure Evaluation (Signed)
  Anesthesia Post-op Note  Patient: Amy Torres  Procedure(s) Performed:  CESAREAN SECTION WITH BILATERAL TUBAL LIGATION - Repeat Cesarean Section With Bilateral Tubal Ligation  Patient Location: PACU and Mother/Baby  Anesthesia Type: Spinal  Level of Consciousness: awake, alert  and oriented  Airway and Oxygen Therapy: Patient Spontanous Breathing  Post-op Pain: mild  Post-op Assessment: Patient's Cardiovascular Status Stable and Respiratory Function Stable  Post-op Vital Signs: stable  Complications: No apparent anesthesia complications

## 2011-01-01 NOTE — Transfer of Care (Signed)
Immediate Anesthesia Transfer of Care Note  Patient: Amy Torres  Procedure(s) Performed:  CESAREAN SECTION WITH BILATERAL TUBAL LIGATION - Repeat Cesarean Section With Bilateral Tubal Ligation  Patient Location: PACU  Anesthesia Type: Spinal  Level of Consciousness: awake, alert  and oriented  Airway & Oxygen Therapy: Patient Spontanous Breathing  Post-op Assessment: Report given to PACU RN  Post vital signs: Reviewed and stable  Complications: No apparent anesthesia complications

## 2011-01-01 NOTE — Progress Notes (Signed)
Pt with elevated pressures in 150-160's dr Jean Rosenthal made aware, discussed toradol injection as well, okay im administration, okay to transfer pt to floor

## 2011-01-01 NOTE — Addendum Note (Signed)
Addendum  created 01/01/11 1827 by Fanny Dance   Modules edited:Notes Section

## 2011-01-01 NOTE — Consult Note (Signed)
This mom is a 37 2/7 gestational diabetic, diet controlled, with chronic HTN and now c superimposed PIH.  Repeat C/S under spinal anesthesia..   At delivery infant in vertex and was delivered with vacuum assistance having a spontaneous cry with was sustained and active movement of all extremities.  Upon being placed under radiant warmer, central color was blue but good HR, respiratory effort and tone observed. Given tactile stimulation with drying and bulb suction of naso/oropharynx yielding minimal fluid obtained.    Shown to parents and then carried to Transitional Nursery by father.   Care to assigned pediatrician.   Dagoberto Ligas MD [facility]

## 2011-01-01 NOTE — Brief Op Note (Signed)
01/01/2011  1:56 PM  PATIENT:  Amy Torres  35 y.o. female  PRE-OPERATIVE DIAGNOSIS:  Previous Cesarean Section; Preeclampsia; Diabetes Mellitus; Hypertension; Desires Sterilization; [redacted] weeks gestation   POST-OPERATIVE DIAGNOSIS:  ; Hypertension at [redacted] weeks gestation ; Desires Sterilization  PROCEDURE:  Procedure(s): CESAREAN SECTION WITH BILATERAL TUBAL LIGATION  SURGEON:  Surgeon(s): Sherron Monday, MD  ANESTHESIA:   spinal  OR FLUID I/O:  Total I/O In: 1800 [I.V.:1800] Out: 750 [Urine:150; Blood:600]  SPECIMEN:  Placenta to research, tubal segments to pathology; viable female infant at 13:03, apgars 8/9, wt = 8#3.6 oz, cord gas 7.17, nl uterus, tubes and ovaries  COUNTS:  YES  DICTATION: .Other Dictation: Dictation Number [id number]  PLAN OF CARE: Admit to inpatient   PATIENT DISPOSITION:  PACU - hemodynamically stable.   Delay start of Pharmacological VTE agent (>24hrs) due to surgical blood loss or risk of bleeding:  not applicable

## 2011-01-01 NOTE — Progress Notes (Signed)
UR chart review completed.  

## 2011-01-01 NOTE — Progress Notes (Signed)
Clean catch UA collected and sent to lab.

## 2011-01-01 NOTE — Anesthesia Procedure Notes (Signed)
Spinal Block  Patient location during procedure: OR Start time: 01/01/2011 12:39 PM Staffing Performed by: anesthesiologist  Preanesthetic Checklist Completed: patient identified, site marked, surgical consent, pre-op evaluation, timeout performed, IV checked, risks and benefits discussed and monitors and equipment checked Spinal Block Patient position: sitting Prep: DuraPrep Patient monitoring: heart rate, cardiac monitor, continuous pulse ox and blood pressure Approach: midline Location: L3-4 Injection technique: single-shot Needle Needle type: Sprotte  Needle gauge: 24 G Needle length: 9 cm Assessment Sensory level: T4 Additional Notes Patient identified.  Risk benefits discussed including failed block, incomplete pain control, headache, nerve damage, paralysis, blood pressure changes, nausea, vomiting, reactions to medication both toxic or allergic, and postpartum back pain.  Patient expressed understanding and wished to proceed.  All questions were answered.  Sterile technique used throughout procedure.  CSF was clear.  No parasthesia or other complications.  Please see nursing notes for vital signs.

## 2011-01-01 NOTE — Anesthesia Postprocedure Evaluation (Signed)
  Anesthesia Post-op Note  Patient: Amy Torres  Procedure(s) Performed:  CESAREAN SECTION WITH BILATERAL TUBAL LIGATION - Repeat Cesarean Section With Bilateral Tubal Ligation  Patient Location: PACU and Mother/Baby  Anesthesia Type: Spinal  Level of Consciousness: awake  Airway and Oxygen Therapy: Patient Spontanous Breathing  Post-op Pain: mild  Post-op Assessment: Patient's Cardiovascular Status Stable and Respiratory Function Stable  Post-op Vital Signs: stable  Complications: No apparent anesthesia complications

## 2011-01-01 NOTE — Anesthesia Postprocedure Evaluation (Signed)
Anesthesia Post Note  Patient: Amy Torres  Procedure(s) Performed:  CESAREAN SECTION WITH BILATERAL TUBAL LIGATION - Repeat Cesarean Section With Bilateral Tubal Ligation  Anesthesia type: Spinal  Patient location: PACU  Post pain: Pain level controlled  Post assessment: Post-op Vital signs reviewed  Last Vitals:  Filed Vitals:   01/01/11 1350  BP: 127/59  Pulse: 68  Temp: 97.6 F (36.4 C)  Resp: 14    Post vital signs: Reviewed  Level of consciousness: awake  Complications: No apparent anesthesia complications

## 2011-01-01 NOTE — Progress Notes (Signed)
RN to the bedside, CHG wipe performed, abd. Clippers used to clip potential incision area (low abd transverse).

## 2011-01-02 LAB — CBC
HCT: 31.1 % — ABNORMAL LOW (ref 36.0–46.0)
Hemoglobin: 10.4 g/dL — ABNORMAL LOW (ref 12.0–15.0)
MCH: 29.5 pg (ref 26.0–34.0)
MCHC: 33.4 g/dL (ref 30.0–36.0)
MCV: 88.1 fL (ref 78.0–100.0)
Platelets: 164 K/uL (ref 150–400)
RBC: 3.53 MIL/uL — ABNORMAL LOW (ref 3.87–5.11)
RDW: 14 % (ref 11.5–15.5)
WBC: 10.5 K/uL (ref 4.0–10.5)

## 2011-01-02 LAB — COMPREHENSIVE METABOLIC PANEL
ALT: 9 U/L (ref 0–35)
Albumin: 1.9 g/dL — ABNORMAL LOW (ref 3.5–5.2)
Alkaline Phosphatase: 80 U/L (ref 39–117)
Chloride: 101 mEq/L (ref 96–112)
Potassium: 4 mEq/L (ref 3.5–5.1)
Sodium: 135 mEq/L (ref 135–145)
Total Bilirubin: 0.3 mg/dL (ref 0.3–1.2)
Total Protein: 4.9 g/dL — ABNORMAL LOW (ref 6.0–8.3)

## 2011-01-02 NOTE — Progress Notes (Signed)
Subjective: Postpartum Day 1: Cesarean Delivery Patient reports incisional pain and no problems voiding.  Nl lochia, pain controlled  Objective: Vital signs in last 24 hours: Temp:  [97.4 F (36.3 C)-98.6 F (37 C)] 98.1 F (36.7 C) (09/20 0730) Pulse Rate:  [62-76] 72  (09/20 0730) Resp:  [14-28] 16  (09/20 0730) BP: (127-176)/(59-107) 130/84 mmHg (09/20 0730) SpO2:  [93 %-99 %] 98 % (09/20 0530) Weight:  [97.478 kg (214 lb 14.4 oz)] 214 lb 14.4 oz (97.478 kg) (09/19 1131)  Physical Exam:  General: alert and no distress Lochia: appropriate Uterine Fundus: firm Incision: healing well DVT Evaluation: No evidence of DVT seen on physical exam.   Basename 01/02/11 0005 01/01/11 0545  HGB 10.4* 11.6*  HCT 31.1* 34.5*    Assessment/Plan: Status post Cesarean section. Doing well postoperatively.  Continue current care.  BOVARD,Shante [place] 01/02/2011, 8:42 AM

## 2011-01-02 NOTE — Consult Note (Signed)
Amy Torres, Amy Torres               ACCOUNT NO.:  [id number]  MEDICAL RECORD NO.:  [id number]  LOCATION:  9145                          FACILITY:  [facility]  PHYSICIAN:  Sherron Monday, MD        DATE OF BIRTH:  1975-06-01  DATE OF CONSULTATION:  01/01/2011 DATE OF DISCHARGE:       Op Note - rLTCS/BTL   ADMITTING DIAGNOSES:  Intrauterine pregnancy at 37+ weeks, preeclampsia, history of low transverse cesarean section, declines vaginal birth after cesarean, undesired fertility.  POSTOPERATIVE DIAGNOSES:  Intrauterine pregnancy at 37+ weeks, preeclampsia, history of low transverse cesarean section, declines vaginal birth after cesarean, undesired fertility.  PROCEDURE:  Repeat low-transverse cesarean section, bilateral tubal ligation by Parkland method.  SURGEON:  Sherron Monday, MD  ESTIMATED BLOOD LOSS:  Approximately 600.  IV FLUIDS:  1800.  URINE OUTPUT:  150 mL of clear urine at the end of the procedure.  COMPLICATIONS:  None except difficult extraction.  FINDINGS:  Viable female infant at 1: 40 with Apgars of 8 and 9, and a weight of 8 pounds 4 ounces.  Normal uterus, tubes, and ovaries are noted.  SPECIMEN:  Placenta to research.  Cord gas 7.17.  PROCEDURE:  After informed consent was reviewed with the patient including risks, benefits, and alternatives of the surgical procedure, she was transported to the OR in stable position where a spinal anesthesia was induced and found to be adequate.  She was then prepped and draped in the normal sterile fashion.  A Pfannenstiel skin incision was made at the level of previous incision approximately two fingerbreadths above the pubic symphysis carried through the underlying layer of fascia sharply.  The fascia was incised in the midline and the incision was extended laterally with Mayo scissors.  The inferior aspect of the fascial incision was grasped with Kocher clamps, elevated, and the rectus muscles were dissected off both bluntly  and sharply. Attention was turned to the superior portion which in similar fashion was grasped with Kocher clamps, elevated, and the rectus muscles were dissected off both bluntly and sharply.  Midline was easily identified. Peritoneum was entered bluntly and this incision was extended superiorly and inferiorly with good visualization of bladder.  The Alexis skin retractor was placed carefully no bowel was entrapped.  The uterus was easily visualized as was the vesicouterine peritoneum.  It was elevated with some pickups and a bladder flap was created both digitally and sharply.  Uterus was incised in a transverse fashion, and the membranes were ruptured for clear fluid.  The infant was delivered with some difficulty and aid of a vacuum from a vertex presentation.  The nose and mouth were suctioned on the field.  Cord was clamped and cut.  Infant was handed off to the awaiting pediatric staff.  The uterus was exteriorized, cleared of all clot and debris.  The uterine incision was closed with 2 layers of 0 Monocryl, the first of which is a running locked and the second as an imbricating layer.  Hemostasis was assured. The bilateral tubal ligation was performed with a Parkland method sending the intervening portion to Pathology.  The uterus was returned to the abdominal cavity.  Copious pelvic irrigation was performed. Tubal sites were inspected and found to be hemostatic.  The second layer of Monocryl was as  the imbricating layer was placed on the uterus. Peritoneum was reapproximated using 2-0 Vicryl in a running fashion. Submucosal and fascial layers were inspected and found to be hemostatic. Fascia was reapproximated using 0 Vicryl.  Subcuticular adipose layer was made hemostatic and irrigated.  The incision was reapproximated using 3-0 plain gut.  The skin was closed with staples.  The patient tolerated the procedure well.  Sponge, lap, and needle count was correct x2 at the end of  procedure per the operating room staff.     Sherron Monday, MD     JB/MEDQ  D:  01/01/2011  T:  01/01/2011  Job:  [id number]

## 2011-01-03 NOTE — Progress Notes (Signed)
CLINICAL SOCIAL WORK  BRIEF PSYCHOSOCIAL ASSESSMENT  Referred by: NICU     On: 01/03/11    For: NICU admission      Patient Interview_X_ Family Interview_X_  Other: MOB's chart   PSYCHOSOCIAL DATA:   Lives Alone  Lives with: baby to be discharged to parents' home.  Primary Support (Name/Relationship): Amy and Amy Torres-parents Degree of support available: good supports  CURRENT CONCERNS:     _X_None noted Substance Abuse     Behavioral Health Issues    Financial Resources     Abuse/Neglect/Domestic Violence   Cultural/Religious Issues     Post-Acute Placement    Adjustment to Illness     Knowledge/Cognitive Deficit      Other:     SOCIAL WORK ASSESSMENT/PLAN/PATIENT'S/FAMILY'S RESPONSE TO PLAN OF CARE: SW met with parents in MOB's first floor room to introduce myself, complete assessment and evaluate how they are coping with baby's admission to NICU.  Parents were very friendly and state they are doing well.  This is their second child, but first experience in NICU.  They appear to be coping well and hopeful that baby's blood sugar will regulate quickly and she will be able to go home with them at MOB's discharge.  SW asked if they will have any transportation issues if baby has to stay.  They said no.  They said they were told they might get to stay at the hospital if MOB is discharged first.  SW explained rooming in and that it is only for one night, when we anticipate d/c the next day.  They stated understanding.  They state they have everything they need for baby at home and have good supports.  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  SW explained support services offered by NICU SWs and gave business card.  Parents were appreciative.  No Further Intervention Required  _X_Psychosocial Support/Ongoing Assessment of Needs Information/Referral to Community Resources Other 

## 2011-01-03 NOTE — Progress Notes (Signed)
Subjective: Postpartum Day 2: Cesarean Delivery Patient reports incisional pain and tolerating PO.  Nl lochia, pain controlled  Objective: Vital signs in last 24 hours: Temp:  [97.9 F (36.6 C)-98.2 F (36.8 C)] 97.9 F (36.6 C) (09/21 0600) Pulse Rate:  [68-98] 75  (09/21 0600) Resp:  [18-20] 18  (09/21 0600) BP: (115-155)/(69-88) 149/88 mmHg (09/21 0600) SpO2:  [96 %-99 %] 99 % (09/20 2145)  Physical Exam:  General: alert and no distress Lochia: appropriate Uterine Fundus: firm Incision: healing well DVT Evaluation: No evidence of DVT seen on physical exam.   Basename 01/02/11 0005 01/01/11 0545  HGB 10.4* 11.6*  HCT 31.1* 34.5*    Assessment/Plan: Status post Cesarean section. Doing well postoperatively.  Continue current care.  Discharge tomorrow.    BOVARD,[place]urn Knaus 01/03/2011, 8:35 AM

## 2011-01-04 NOTE — Progress Notes (Signed)
POD#3 Doing well, baby ok in NICU, not sure if the baby will be able to go home today, wants to stay if baby stays Afeb, VSS Abd- soft, fundus firm, incision intact Will continue routine care.  Will discharge later today if baby can go home, otherwise discharge tomorrow.

## 2011-01-05 MED ORDER — OXYCODONE-ACETAMINOPHEN 5-325 MG PO TABS: 1.0000 | ORAL_TABLET | ORAL | Status: AC | PRN

## 2011-01-05 NOTE — Progress Notes (Signed)
POD#4 Doing ok, sore, baby with jaundice-will not be going home yet Afeb, VSS Abd- soft, fundus firm, incision intact Will d/c home

## 2011-01-05 NOTE — Discharge Summary (Signed)
Obstetric Discharge Summary Reason for Admission: cesarean section and observation/evaluation for preeclampsia Prenatal Procedures: NST Intrapartum Procedures: cesarean: low cervical, transverse with BTL Postpartum Procedures: none Complications-Operative and Postpartum: none Hemoglobin  Date Value Range Status  01/02/2011 10.4* 12.0-15.0 (g/dL) Final     HCT  Date Value Range Status  01/02/2011 31.1* 36.0-46.0 (%) Final    Discharge Diagnoses: Term Pregnancy-delivered, Preelampsia and previous cesarean section  Discharge Information: Date: 01/05/2011 Activity: pelvic rest and no strenuous activity Diet: routine Medications: Ibuprophen and Percocet Condition: stable Instructions: refer to practice specific booklet Discharge to: home Follow-up Information    Follow up with BOVARD,JODY, MD. Make an appointment in 4 days. (for staple removal)    Contact information:   [address] [place] [address] [phone]          Newborn Data: Live born female  Birth Weight: 8 lb 3.6 oz (3731 g) APGAR: 8, 9  Baby staying in NICU for jaundice.  Jaray Boliver D 01/05/2011, 9:54 AM

## 2011-01-09 LAB — HCG, QUANTITATIVE, PREGNANCY
hCG, Beta Chain, Quant, S: 2059 — ABNORMAL HIGH
hCG, Beta Chain, Quant, S: 311 — ABNORMAL HIGH
hCG, Beta Chain, Quant, S: 797 — ABNORMAL HIGH

## 2011-01-09 LAB — URINALYSIS, ROUTINE W REFLEX MICROSCOPIC
Bilirubin Urine: NEGATIVE
Glucose, UA: NEGATIVE
Specific Gravity, Urine: 1.025
pH: 5

## 2011-01-09 LAB — URINE MICROSCOPIC-ADD ON: WBC, UA: NONE SEEN

## 2011-01-09 LAB — WET PREP, GENITAL: Yeast Wet Prep HPF POC: NONE SEEN

## 2011-02-05 NOTE — Progress Notes (Signed)
Encounter addended by: Elaina Hoops, RN on: 02/05/2011 10:38 PM<BR>     Documentation filed: Charges VN

## 2011-06-04 ENCOUNTER — Telehealth: Payer: Self-pay | Admitting: Family Medicine

## 2011-06-04 NOTE — Telephone Encounter (Signed)
Refill: Labetalol Hcl 300mg  tablet. Take 1 tablet by mouth twice daily. Qty 60. Last fill 05-05-11

## 2011-06-05 ENCOUNTER — Other Ambulatory Visit: Payer: Self-pay | Admitting: Family Medicine

## 2011-06-05 MED ORDER — LABETALOL HCL 300 MG PO TABS: 300.0000 mg | ORAL_TABLET | Freq: Two times a day (BID) | ORAL | Status: DC

## 2011-06-05 NOTE — Telephone Encounter (Signed)
rx sent to pharmacy by e-script for #30 Letter has been mailed to pt address noted in the chart to advise they are overdue for cpe/ov/labs and the pt needs to contact office to set up appt    

## 2011-07-16 ENCOUNTER — Encounter: Payer: Self-pay | Admitting: Family Medicine

## 2011-07-16 ENCOUNTER — Ambulatory Visit (INDEPENDENT_AMBULATORY_CARE_PROVIDER_SITE_OTHER): Payer: 59 | Admitting: Family Medicine

## 2011-07-16 VITALS — BP 135/82 | HR 70 | Temp 97.9°F | Ht 62.0 in | Wt 184.8 lb

## 2011-07-16 DIAGNOSIS — J019 Acute sinusitis, unspecified: Secondary | ICD-10-CM

## 2011-07-16 DIAGNOSIS — J45909 Unspecified asthma, uncomplicated: Secondary | ICD-10-CM

## 2011-07-16 MED ORDER — AMOXICILLIN 875 MG PO TABS: 875.0000 mg | ORAL_TABLET | Freq: Two times a day (BID) | ORAL | Status: DC

## 2011-07-16 MED ORDER — LABETALOL HCL 300 MG PO TABS: 300.0000 mg | ORAL_TABLET | Freq: Two times a day (BID) | ORAL | Status: DC

## 2011-07-16 MED ORDER — ALBUTEROL SULFATE HFA 108 (90 BASE) MCG/ACT IN AERS: 2.0000 | INHALATION_SPRAY | RESPIRATORY_TRACT | Status: DC | PRN

## 2011-07-16 NOTE — Progress Notes (Signed)
  Subjective:    Patient ID: Amy Torres, female    DOB: 02-Nov-1975, 36 y.o.   MRN: [id number]  Asthma   Asthma- sxs started Sunday.  Typically only has trouble during allergy season and when she has URI.  Bathroom recently remodeled w/ tile dust everywhere.  + headache and sinus pressure + dry cough.  + nasal congestion.  + nocturnal wheezing.  No fevers.  Took benadryl yesterday w/out relief.   Review of Systems For ROS see HPI     Objective:   Physical Exam  Vitals reviewed. Constitutional: She appears well-developed and well-nourished. No distress.  HENT:  Head: Normocephalic and atraumatic.  Right Ear: Tympanic membrane normal.  Left Ear: Tympanic membrane normal.  Nose: Mucosal edema and rhinorrhea present. Right sinus exhibits maxillary sinus tenderness and frontal sinus tenderness. Left sinus exhibits maxillary sinus tenderness and frontal sinus tenderness.  Mouth/Throat: Uvula is midline and mucous membranes are normal. Posterior oropharyngeal erythema present. No oropharyngeal exudate.  Eyes: Conjunctivae and EOM are normal. Pupils are equal, round, and reactive to light.  Neck: Normal range of motion. Neck supple.  Cardiovascular: Normal rate, regular rhythm and normal heart sounds.   Pulmonary/Chest: Effort normal and breath sounds normal. No respiratory distress. She has no wheezes.  Lymphadenopathy:    She has no cervical adenopathy.          Assessment & Plan:

## 2011-07-16 NOTE — Assessment & Plan Note (Signed)
Pt's sxs and PE consistent w/ infxn.  Start abx.  Reviewed supportive care and red flags that should prompt return.  Pt expressed understanding and is in agreement w/ plan. r 

## 2011-07-16 NOTE — Assessment & Plan Note (Signed)
Deteriorated.  Pt again w/ URI and again symptomatic w/ wheezing.  Restart albuterol inhaler.  Reviewed supportive care and red flags that should prompt return.  Pt expressed understanding and is in agreement w/ plan.

## 2011-07-16 NOTE — Patient Instructions (Signed)
This is a sinus infection Start the Amoxicillin- take w/ food to avoid upset stomach Add Claritin to help w/ your allergies- this is safe in breastfeeding Drink plenty of fluids Use the albuterol inhaler as needed for shortness of breath/wheezing Call with any questions or concerns Hang in there!

## 2011-08-13 ENCOUNTER — Encounter: Payer: 59 | Admitting: Family Medicine

## 2011-09-29 ENCOUNTER — Ambulatory Visit (INDEPENDENT_AMBULATORY_CARE_PROVIDER_SITE_OTHER): Payer: 59 | Admitting: Family Medicine

## 2011-09-29 ENCOUNTER — Encounter: Payer: Self-pay | Admitting: Family Medicine

## 2011-09-29 VITALS — BP 124/87 | HR 71 | Temp 97.7°F | Ht 61.0 in | Wt 181.4 lb

## 2011-09-29 DIAGNOSIS — J019 Acute sinusitis, unspecified: Secondary | ICD-10-CM

## 2011-09-29 MED ORDER — AMOXICILLIN 875 MG PO TABS: 875.0000 mg | ORAL_TABLET | Freq: Two times a day (BID) | ORAL | Status: AC

## 2011-09-29 NOTE — Assessment & Plan Note (Signed)
Recurrent issue.  This is likely the cause of her LAD.  Start abx.  Reviewed supportive care and red flags that should prompt return.  Pt expressed understanding and is in agreement w/ plan.

## 2011-09-29 NOTE — Patient Instructions (Addendum)
This is a sinus infection w/ swollen lymph nodes Start the Amoxicillin twice daily- this will treat both Tylenol as needed for pain REST! Hang in there!

## 2011-09-29 NOTE — Progress Notes (Signed)
  Subjective:    Patient ID: Amy Torres, female    DOB: 12-09-1975, 36 y.o.   MRN: [id number]  HPI L sided jaw pain- sxs started yesterday.  No known injury.  No pain w/ swallowing, mild pain w/ chewing.  Pain is worst w/ turning neck to R.  Pain will radiate up into ear.  No fevers.  + HA.  Took tylenol w/ some relief.  No known sick contacts.     Review of Systems For ROS see HPI     Objective:   Physical Exam  Vitals reviewed. Constitutional: She appears well-developed and well-nourished. No distress.  HENT:  Head: Normocephalic and atraumatic.  Right Ear: Tympanic membrane normal.  Left Ear: Tympanic membrane normal.  Nose: Mucosal edema and rhinorrhea present. Right sinus exhibits maxillary sinus tenderness and frontal sinus tenderness. Left sinus exhibits maxillary sinus tenderness and frontal sinus tenderness.  Mouth/Throat: Uvula is midline and mucous membranes are normal. Posterior oropharyngeal erythema present. No oropharyngeal exudate.  Eyes: Conjunctivae and EOM are normal. Pupils are equal, round, and reactive to light.  Neck: Normal range of motion. Neck supple.  Cardiovascular: Normal rate, regular rhythm and normal heart sounds.   Pulmonary/Chest: Effort normal and breath sounds normal. No respiratory distress. She has no wheezes.  Lymphadenopathy:    She has cervical adenopathy (L submandibular).          Assessment & Plan:

## 2011-10-06 ENCOUNTER — Ambulatory Visit (INDEPENDENT_AMBULATORY_CARE_PROVIDER_SITE_OTHER): Payer: 59 | Admitting: Family Medicine

## 2011-10-06 ENCOUNTER — Encounter: Payer: Self-pay | Admitting: Family Medicine

## 2011-10-06 VITALS — BP 125/75 | HR 77 | Temp 97.5°F | Ht 61.25 in | Wt 181.0 lb

## 2011-10-06 DIAGNOSIS — E119 Type 2 diabetes mellitus without complications: Secondary | ICD-10-CM

## 2011-10-06 DIAGNOSIS — Z0000 Encounter for general adult medical examination without abnormal findings: Secondary | ICD-10-CM | POA: Insufficient documentation

## 2011-10-06 DIAGNOSIS — R300 Dysuria: Secondary | ICD-10-CM

## 2011-10-06 DIAGNOSIS — I10 Essential (primary) hypertension: Secondary | ICD-10-CM

## 2011-10-06 DIAGNOSIS — E785 Hyperlipidemia, unspecified: Secondary | ICD-10-CM

## 2011-10-06 LAB — CBC WITH DIFFERENTIAL/PLATELET
Basophils Absolute: 0 10*3/uL (ref 0.0–0.1)
Hemoglobin: 13.2 g/dL (ref 12.0–15.0)
Lymphocytes Relative: 36.8 % (ref 12.0–46.0)
Monocytes Relative: 8.1 % (ref 3.0–12.0)
Neutro Abs: 3.1 10*3/uL (ref 1.4–7.7)
RBC: 4.48 Mil/uL (ref 3.87–5.11)
RDW: 13 % (ref 11.5–14.6)

## 2011-10-06 LAB — HEPATIC FUNCTION PANEL
ALT: 29 U/L (ref 0–35)
AST: 23 U/L (ref 0–37)
Alkaline Phosphatase: 61 U/L (ref 39–117)
Bilirubin, Direct: 0.1 mg/dL (ref 0.0–0.3)
Total Protein: 7.4 g/dL (ref 6.0–8.3)

## 2011-10-06 LAB — POCT URINALYSIS DIPSTICK
Blood, UA: NEGATIVE
Ketones, UA: NEGATIVE
Protein, UA: NEGATIVE
Spec Grav, UA: >=1.03
Urobilinogen, UA: 0.2

## 2011-10-06 LAB — BASIC METABOLIC PANEL
CO2: 26 mEq/L (ref 19–32)
Chloride: 105 mEq/L (ref 96–112)
Creatinine, Ser: 0.7 mg/dL (ref 0.4–1.2)
Potassium: 3.6 mEq/L (ref 3.5–5.1)

## 2011-10-06 LAB — LIPID PANEL
LDL Cholesterol: 94 mg/dL (ref 0–99)
Total CHOL/HDL Ratio: 4

## 2011-10-06 NOTE — Patient Instructions (Addendum)
We'll notify you of your lab results and determine when you need to follow up You look great!  Keep up the good work! Call with any questions or concerns Have a great summer!!!

## 2011-10-06 NOTE — Progress Notes (Signed)
  Subjective:    Patient ID: Amy Torres, female    DOB: 27-Nov-1975, 36 y.o.   MRN: [id number]  HPI CPE- pt concerned for possible UTI.  + dysuria, frequency, urgency.  sxs started 3 days ago.  UTD on GYN   Review of Systems Patient reports no vision/ hearing changes, adenopathy,fever, weight change,  persistant/recurrent hoarseness , swallowing issues, chest pain, palpitations, edema, persistant/recurrent cough, hemoptysis, dyspnea (rest/exertional/paroxysmal nocturnal), gastrointestinal bleeding (melena, rectal bleeding), abdominal pain, significant heartburn, bowel changes, Gyn symptoms (abnormal  bleeding, pain),  syncope, focal weakness, memory loss, numbness & tingling, skin/hair/nail changes, abnormal bruising or bleeding, anxiety, or depression.     Objective:   Physical Exam General Appearance:    Alert, cooperative, no distress, appears stated age  Head:    Normocephalic, without obvious abnormality, atraumatic  Eyes:    PERRL, conjunctiva/corneas clear, EOM's intact, fundi    benign, both eyes  Ears:    Normal TM's and external ear canals, both ears  Nose:   Nares normal, septum midline, mucosa normal, no drainage    or sinus tenderness  Throat:   Lips, mucosa, and tongue normal; teeth and gums normal  Neck:   Supple, symmetrical, trachea midline, no adenopathy;    Thyroid: no enlargement/tenderness/nodules  Back:     Symmetric, no curvature, ROM normal, no CVA tenderness  Lungs:     Clear to auscultation bilaterally, respirations unlabored  Chest Wall:    No tenderness or deformity   Heart:    Regular rate and rhythm, S1 and S2 normal, no murmur, rub   or gallop  Breast Exam:    Deferred to GYN  Abdomen:     Soft, mild suprapubic tenderness, no CVA tenderness, bowel sounds active all four quadrants, no masses, no organomegaly  Genitalia:    Deferred to GYN  Rectal:    Extremities:   Extremities normal, atraumatic, no cyanosis or edema  Pulses:   2+ and symmetric all  extremities  Skin:   Skin color, texture, turgor normal, no rashes or lesions  Lymph nodes:   Cervical, supraclavicular, and axillary nodes normal  Neurologic:   CNII-XII intact, normal strength, sensation and reflexes    throughout          Assessment & Plan:

## 2011-10-07 NOTE — Assessment & Plan Note (Signed)
Chronic problem.  Check labs.  tx prn.

## 2011-10-07 NOTE — Assessment & Plan Note (Signed)
Chronic problem.  Not currently on meds.  Overdue for labs.

## 2011-10-07 NOTE — Assessment & Plan Note (Signed)
New.  UA unremarkable w/ exception of dehydration.  Pt already on high dose Amox for sinus infxn.  Will hold on additional abx at this time.  Encouraged increased fluid intake.  Pt to call if sxs worsen.  Pt expressed understanding and is in agreement w/ plan.

## 2011-10-07 NOTE — Assessment & Plan Note (Signed)
Chronic problem.  Adequate control.  Check labs 

## 2011-10-07 NOTE — Assessment & Plan Note (Signed)
Pt's PE WNL.  UTD on GYN.  Check labs.  Adjust/start meds prn.  Anticipatory guidance provided.

## 2011-10-10 LAB — VITAMIN D 1,25 DIHYDROXY
Vitamin D 1, 25 (OH)2 Total: 87 pg/mL — ABNORMAL HIGH (ref 18–72)
Vitamin D2 1, 25 (OH)2: <8 pg/mL
Vitamin D3 1, 25 (OH)2: 87 pg/mL

## 2011-10-13 ENCOUNTER — Encounter: Payer: Self-pay | Admitting: *Deleted

## 2011-11-04 ENCOUNTER — Telehealth: Payer: Self-pay | Admitting: Family Medicine

## 2011-11-04 NOTE — Telephone Encounter (Signed)
Pt is breast feeding so we must be careful in the abx we pick.  If she is again having sxs, will need OV

## 2011-11-04 NOTE — Telephone Encounter (Signed)
Will leave for Dr Beverely Low to see---FYI

## 2011-11-04 NOTE — Telephone Encounter (Signed)
Pts husband called stating the pt has been seen for sinus infections and we keep giving her amoxicillin. He believes she needs to take something stronger because they keep coming back. [facility] [address] [facility]. Call wife at [phone]. Leave vm if/when rx has been sent in on husbands # [phone] so he can pick it up for her. He is listed on her DPR.

## 2011-11-04 NOTE — Telephone Encounter (Signed)
Spoke to pt to advise results/instructions. Pt understood, pt advised she will call back tomorrow to schedule apt per has to speak to husband concerning a ride per does not drive.

## 2011-11-04 NOTE — Telephone Encounter (Signed)
Pt was seen on 09-29-11 for sinusitis and given amoxicillin (AMOXIL) 875 MG tablet. Pt has not been seen for this issue since then. OV needed for re-evaluation since it been over 30 days or are you willing to Rx another med.Please advise

## 2011-11-05 ENCOUNTER — Ambulatory Visit (INDEPENDENT_AMBULATORY_CARE_PROVIDER_SITE_OTHER): Payer: 59 | Admitting: Family Medicine

## 2011-11-05 ENCOUNTER — Encounter: Payer: Self-pay | Admitting: Family Medicine

## 2011-11-05 VITALS — BP 128/70 | HR 78 | Temp 98.3°F | Ht 61.0 in | Wt 178.4 lb

## 2011-11-05 DIAGNOSIS — J019 Acute sinusitis, unspecified: Secondary | ICD-10-CM

## 2011-11-05 MED ORDER — FLUTICASONE PROPIONATE 50 MCG/ACT NA SUSP: 2.0000 | Freq: Every day | NASAL | Status: DC

## 2011-11-05 MED ORDER — SULFAMETHOXAZOLE-TRIMETHOPRIM 800-160 MG PO TABS: 1.0000 | ORAL_TABLET | Freq: Two times a day (BID) | ORAL | Status: AC

## 2011-11-05 MED ORDER — FLUCONAZOLE 150 MG PO TABS: 150.0000 mg | ORAL_TABLET | Freq: Once | ORAL | Status: DC

## 2011-11-05 NOTE — Telephone Encounter (Signed)
Pt to be seen by MD Tabori today in office

## 2011-11-05 NOTE — Assessment & Plan Note (Signed)
Recurrent issue.  Likely due to untreated allergies.  Start abx for infxn and restart allergy meds.  Reviewed supportive care and red flags that should prompt return.  Pt expressed understanding and is in agreement w/ plan.

## 2011-11-05 NOTE — Progress Notes (Signed)
  Subjective:    Patient ID: Amy Torres, female    DOB: 30-Jun-1975, 36 y.o.   MRN: [id number]  HPI ? Sinus infxn- pt was tx'd w/ Amox 5 weeks ago for sinus infxn, sxs are intermittent.  + nasal congestion, mild cough, sneezing, no itchy/watery eyes.  Having facial pressure.  No fevers.  No known sick contacts.  Hx of seasonal allergies but not currently on meds.   Review of Systems For ROS see HPI     Objective:   Physical Exam  Vitals reviewed. Constitutional: She appears well-developed and well-nourished. No distress.  HENT:  Head: Normocephalic and atraumatic.  Right Ear: Tympanic membrane normal.  Left Ear: Tympanic membrane normal.  Nose: Mucosal edema and rhinorrhea present. Right sinus exhibits maxillary sinus tenderness and frontal sinus tenderness. Left sinus exhibits maxillary sinus tenderness and frontal sinus tenderness.  Mouth/Throat: Uvula is midline and mucous membranes are normal. Posterior oropharyngeal erythema present. No oropharyngeal exudate.  Eyes: Conjunctivae and EOM are normal. Pupils are equal, round, and reactive to light.  Neck: Normal range of motion. Neck supple.  Cardiovascular: Normal rate, regular rhythm and normal heart sounds.   Pulmonary/Chest: Effort normal and breath sounds normal. No respiratory distress. She has no wheezes.  Lymphadenopathy:    She has no cervical adenopathy.          Assessment & Plan:

## 2011-11-05 NOTE — Telephone Encounter (Signed)
Pt treated by MD Beverely Low today

## 2011-11-05 NOTE — Patient Instructions (Addendum)
Start the Bactrim twice daily for the sinus infection (may make you sun sensitive) Start Claritin daily for the allergy component Add Flonase- 2 sprays each nostril daily- for the nasal congestion and sinus blockage Use the Diflucan as needed for yeast Call with any questions or concerns Hang in there!

## 2011-12-08 ENCOUNTER — Other Ambulatory Visit: Payer: Self-pay | Admitting: Family Medicine

## 2011-12-09 NOTE — Telephone Encounter (Signed)
rx sent to pharmacy by e-script  

## 2012-02-04 ENCOUNTER — Ambulatory Visit (INDEPENDENT_AMBULATORY_CARE_PROVIDER_SITE_OTHER): Payer: 59

## 2012-02-04 DIAGNOSIS — Z23 Encounter for immunization: Secondary | ICD-10-CM

## 2012-04-05 ENCOUNTER — Telehealth: Payer: Self-pay | Admitting: Family Medicine

## 2012-04-05 MED ORDER — PERMETHRIN 5 % EX CREA: TOPICAL_CREAM | Freq: Once | CUTANEOUS | Status: DC

## 2012-04-05 NOTE — Telephone Encounter (Signed)
Ok for elimite topical cream to apply from jaw down, covering entire body.  Disp 1 tube, 1 refill

## 2012-04-05 NOTE — Telephone Encounter (Signed)
Called to request [facility].  Has generalized, itchy rash for past 2 months.  Afebrile.  Amy Torres is at work.  Two children diagnosed with scabies at [facility] office 04/05/12; parent was advised to contact her own MD for treatment as well.  Please call back to advise if must be seen.

## 2012-04-05 NOTE — Telephone Encounter (Signed)
LMOM informing the pt that an cream has been called to her pharmacy.  If she has any question to please give [place] a call back.  Rx was seen to pharmacy by e-script.//AB/CMA

## 2012-05-26 ENCOUNTER — Telehealth: Payer: Self-pay | Admitting: Family Medicine

## 2012-05-26 MED ORDER — LABETALOL HCL 300 MG PO TABS: 300.0000 mg | ORAL_TABLET | Freq: Two times a day (BID) | ORAL | Status: DC

## 2012-05-26 NOTE — Telephone Encounter (Signed)
refill  Labetalol HCl (Tab) 300 MG TAKE 1 TABLET (300 MG TOTAL) BY MOUTH 2 (TWO) TIMES DAILY.  #60 wt4-refills--last fill 1.2.14

## 2012-05-26 NOTE — Telephone Encounter (Signed)
Rx sent to the pharmacy([facility] [address] by e-script.//AB/CMA

## 2012-09-13 ENCOUNTER — Encounter: Payer: Self-pay | Admitting: Family Medicine

## 2012-09-13 ENCOUNTER — Ambulatory Visit (INDEPENDENT_AMBULATORY_CARE_PROVIDER_SITE_OTHER): Payer: 59 | Admitting: Family Medicine

## 2012-09-13 VITALS — BP 138/78 | HR 75 | Temp 98.0°F | Ht 60.75 in | Wt 177.2 lb

## 2012-09-13 DIAGNOSIS — R05 Cough: Secondary | ICD-10-CM

## 2012-09-13 DIAGNOSIS — J45901 Unspecified asthma with (acute) exacerbation: Secondary | ICD-10-CM | POA: Insufficient documentation

## 2012-09-13 DIAGNOSIS — J019 Acute sinusitis, unspecified: Secondary | ICD-10-CM

## 2012-09-13 DIAGNOSIS — J4521 Mild intermittent asthma with (acute) exacerbation: Secondary | ICD-10-CM

## 2012-09-13 DIAGNOSIS — R059 Cough, unspecified: Secondary | ICD-10-CM

## 2012-09-13 MED ORDER — IPRATROPIUM BROMIDE 0.02 % IN SOLN
0.5000 mg | Freq: Once | RESPIRATORY_TRACT | Status: AC
  Administered 2012-09-13: 0.5 mg via RESPIRATORY_TRACT

## 2012-09-13 MED ORDER — ALBUTEROL SULFATE (5 MG/ML) 0.5% IN NEBU
2.5000 mg | INHALATION_SOLUTION | Freq: Once | RESPIRATORY_TRACT | Status: AC
  Administered 2012-09-13: 2.5 mg via RESPIRATORY_TRACT

## 2012-09-13 MED ORDER — ALBUTEROL SULFATE HFA 108 (90 BASE) MCG/ACT IN AERS: 2.0000 | INHALATION_SPRAY | RESPIRATORY_TRACT | Status: DC | PRN

## 2012-09-13 MED ORDER — AMOXICILLIN 875 MG PO TABS: 875.0000 mg | ORAL_TABLET | Freq: Two times a day (BID) | ORAL | Status: DC

## 2012-09-13 NOTE — Patient Instructions (Addendum)
This is a sinus infection and asthma combo Start the Amoxicillin twice daily- take w/ food Use the albuterol inhaler- 2 puffs every 4 hrs as needed for chest tightness/shortness of breath Call with any questions or concerns Hang in there!!!

## 2012-09-13 NOTE — Progress Notes (Signed)
  Subjective:    Patient ID: Amy Torres, female    DOB: [birth year], 37 y.o.   MRN: [id number]  HPI URI- pt developed cough and cold x1 month.  Started as allergy sxs.  Yesterday had SOB and trouble breathing.  + wheezing.  No fevers.  Cough is productive but infrequent.  No facial pain/pressure.  No ear pain.  No N/V/D.  + sick contacts.   Review of Systems For ROS see HPI     Objective:   Physical Exam  Vitals reviewed. Constitutional: She appears well-developed and well-nourished. No distress.  HENT:  Head: Normocephalic and atraumatic.  Right Ear: Tympanic membrane normal.  Left Ear: Tympanic membrane normal.  Nose: Mucosal edema and rhinorrhea present. Right sinus exhibits maxillary sinus tenderness. Right sinus exhibits no frontal sinus tenderness. Left sinus exhibits maxillary sinus tenderness. Left sinus exhibits no frontal sinus tenderness.  Mouth/Throat: Uvula is midline and mucous membranes are normal. Posterior oropharyngeal erythema present. No oropharyngeal exudate.  Eyes: Conjunctivae and EOM are normal. Pupils are equal, round, and reactive to light.  Neck: Normal range of motion. Neck supple.  Cardiovascular: Normal rate, regular rhythm and normal heart sounds.   Pulmonary/Chest: Effort normal. No respiratory distress. She has no wheezes.  Decreased air movement diffusely- much improved s/p neb tx  Lymphadenopathy:    She has no cervical adenopathy.          Assessment & Plan:

## 2012-09-13 NOTE — Assessment & Plan Note (Signed)
Pt's sxs consistent w/ infxn.  Start abx.  Reviewed supportive care and red flags that should prompt return.  Pt expressed understanding and is in agreement w/ plan.  

## 2012-09-13 NOTE — Assessment & Plan Note (Signed)
New.  Pt's sxs and PE consistent w/ RAD likely triggered by URI.  Lungs cleared w/ albuterol neb.  Albuterol HFA script written

## 2012-10-02 ENCOUNTER — Encounter: Payer: Self-pay | Admitting: Family Medicine

## 2012-10-02 ENCOUNTER — Ambulatory Visit (INDEPENDENT_AMBULATORY_CARE_PROVIDER_SITE_OTHER): Payer: 59 | Admitting: Family Medicine

## 2012-10-02 VITALS — BP 110/82 | HR 60 | Temp 98.5°F | Wt 178.0 lb

## 2012-10-02 DIAGNOSIS — J4531 Mild persistent asthma with (acute) exacerbation: Secondary | ICD-10-CM

## 2012-10-02 DIAGNOSIS — J45901 Unspecified asthma with (acute) exacerbation: Secondary | ICD-10-CM

## 2012-10-02 MED ORDER — PREDNISONE 20 MG PO TABS: ORAL_TABLET | ORAL | Status: DC

## 2012-10-02 MED ORDER — ALBUTEROL SULFATE HFA 108 (90 BASE) MCG/ACT IN AERS: 2.0000 | INHALATION_SPRAY | RESPIRATORY_TRACT | Status: DC | PRN

## 2012-10-02 MED ORDER — MONTELUKAST SODIUM 10 MG PO TABS: 10.0000 mg | ORAL_TABLET | Freq: Every day | ORAL | Status: DC

## 2012-10-02 NOTE — Patient Instructions (Addendum)
Nice to meet you. Please take prednisone in the morning and with food as directed. Start Singulair 10 mg daily. Continue as needed albuterol inhaler. Please call you primary doctor on Monday with an update. Please go to urgent care or ER if your symptoms worsen over the weekend.

## 2012-10-02 NOTE — Progress Notes (Signed)
Subjective:    Patient ID: Amy Torres, female    DOB: 02-Aug-1975, 37 y.o.   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  Was seen by PCP earlier this month.  Note reviewed.  Treated for sinus infection with Amoxicillin.  Also had symptoms of RAD with wheezing and SOB.  Had asthma years ago but nothing recently until this month- felt to be RAD triggered by URI.  Sinus pressure and wheezing had resolved until a few days ago when she started having runny nose, sneezing, cough.  Wheezing x 1 day.  No SOB or fevers.  Cough is dry.  Not taking any antihistamines.  Did use albuterol inhaler shortly before coming here and was using it every 20 minutes last night per Call  A Nurse advise.     Patient Active Problem List   Diagnosis Date Noted  . RAD (reactive airway disease) 09/13/2012  . Physical exam, annual 10/06/2011  . Dysuria 10/06/2011  . Previous cesarean delivery, delivered 01/01/2011  . Mild preeclampsia 12/31/2010  . ANKLE PAIN 06/04/2010  . SINUSITIS - ACUTE-NOS 04/11/2010  . ABORTION, SPONTANEOUS 04/11/2010  . DM 06/25/2009  . PROTEINURIA 06/25/2009  . Other and unspecified hyperlipidemia 06/11/2009  . HYPERGLYCEMIA, MILD 06/11/2009  . HYPERTENSION 11/06/2008  . RHINITIS 11/06/2008  . ASTHMA 11/06/2008   Past Medical History  Diagnosis Date  . Asthma   . Hypertension   . Miscarriage     12/11  . Diabetes mellitus     borderline gestational  . Pregnancy, complicated 12/31/2010  . Previous cesarean delivery, delivered 01/01/2011   Past Surgical History  Procedure Laterality Date  . Breast cyst excision      left breast  . Cesarean section     History  Substance Use Topics  . Smoking status: Never Smoker   . Smokeless tobacco: Not on file  . Alcohol Use: No   Family History  Problem Relation Age of Onset  . Hypertension Mother   . Stroke Mother   . Cancer Maternal Aunt     breast cancer  . Cancer Paternal Aunt      breast cancer   No Known Allergies Current Outpatient Prescriptions on File Prior to Visit  Medication Sig Dispense Refill  . acetaminophen (TYLENOL) 500 MG tablet Take 500 mg by mouth every 6 (six) hours as needed. Patient takes medication for pain.       Marland Kitchen albuterol (PROVENTIL HFA;VENTOLIN HFA) 108 (90 BASE) MCG/ACT inhaler Inhale 2 puffs into the lungs every 4 (four) hours as needed for wheezing or shortness of breath.  1 Inhaler  3  . amoxicillin (AMOXIL) 875 MG tablet Take 1 tablet (875 mg total) by mouth 2 (two) times daily.  20 tablet  0  . fluticasone (FLONASE) 50 MCG/ACT nasal spray Place 2 sprays into the nose daily.  16 g  6  . labetalol (NORMODYNE) 300 MG tablet Take 1 tablet (300 mg total) by mouth 2 (two) times daily.  60 tablet  5  . omeprazole (PRILOSEC) 40 MG capsule Take 40 mg by mouth daily.       . ondansetron (ZOFRAN) 8 MG tablet Take 8 mg by mouth every 8 (eight) hours as needed. For nausea      . permethrin (ELIMITE) 5 % cream Apply topically once. Apply from jaw down,covering the entire body  60 g  1   No current facility-administered medications on file prior to visit.  The PMH, PSH, Social History, Family History, Medications, and allergies have been reviewed in [facility], and have been updated if relevant.   Review of Systems For ROS see HPI     Objective:   Physical Exam  BP 110/82  Pulse 60  Temp(Src) 98.5 F (36.9 C) (Oral)  Wt 178 lb (80.74 kg)  BMI 33.91 kg/m2  SpO2 97%  LMP 09/07/2012  Vitals reviewed. Constitutional: She appears well-developed and well-nourished. No distress.  HENT:  Head: Normocephalic and atraumatic.  Right Ear: Tympanic membrane normal.  Left Ear: Tympanic membrane normal.  Nose: Mucosal erythem and rhinorrhea present.  No sinus tenderness Mouth/Throat: Uvula is midline and mucous membranes are normal. Mild erythema present. No oropharyngeal exudate.  Eyes: Conjunctivae and EOM are normal. Pupils are equal, round, and  reactive to light.  Neck: Normal range of motion. Neck supple.  Cardiovascular: Normal rate, regular rhythm and normal heart sounds.   Pulmonary/Chest: Effort normal. No respiratory distress. She has no wheezes.  No increased WOB Lymphadenopathy:    She has no cervical adenopathy.     Assessment & Plan:  1. RAD (reactive airway disease), mild persistent, with acute exacerbation Lungs clear but just used proair inhaler and had been using it every 20 minutes. Will start Singulair. Also one week of oral prednisone (lower dose since she has not used in past).  Will also taper to see if she needs extended course. She will contact PCP on Monday. Red flags discussed. The patient indicates understanding of these issues and agrees with the plan.

## 2012-10-04 ENCOUNTER — Telehealth: Payer: Self-pay | Admitting: *Deleted

## 2012-10-04 NOTE — Telephone Encounter (Signed)
Reason for Call: Caller: Carl/Spouse; PCP: Sheliah Hatch.; CB#: [phone]; Call regarding Asthma. Baldo Ash says her asthma is flared up and he wants appt for [facility]. Onset sx's June 18 and worse when at work tonight. Using inhaler but "not helping much." Was treated for sinus infection with Amox in May and says her asthma flared then as well. Using inhaler q 4 hrs and says it does give relief; last was at 2200 and still wheezing now. Caller continues to clear throat but no audible wheezing; is able to speak without interruption; no coughing. Per Asthma guideline, Rn advised to go to ED due to currently wheezing with chest tightness. She declines saying she feels okay to wait and requests AM appt. Rn scheduled appt at 0930 but still encouraged pt to go to ED. She voiced understanding and says she will go if sx's worsen. Protocol(s) Used: Asthma - Adult Recommended Outcome per Protocol: Call Provider Immediately Override Outcome if Used in Protocol: See ED Immediately RN Reason for Override Outcome: Nursing Judgement Used. Reason for Outcome: Currently wheezing, having chest tightness, or labored breathing AND has been hospitalized or has had ED care for breathing difficulty in the past month. Care Advice: ~ Rescue medication at recommended dose can be used every 20 minutes up to 3 doses. 06/

## 2012-10-12 ENCOUNTER — Encounter: Payer: 59 | Admitting: Family Medicine

## 2012-12-08 ENCOUNTER — Encounter: Payer: Self-pay | Admitting: Family Medicine

## 2012-12-08 ENCOUNTER — Ambulatory Visit (INDEPENDENT_AMBULATORY_CARE_PROVIDER_SITE_OTHER): Payer: 59 | Admitting: Family Medicine

## 2012-12-08 VITALS — BP 118/68 | HR 65 | Temp 98.8°F | Ht 60.75 in | Wt 176.0 lb

## 2012-12-08 DIAGNOSIS — Z1331 Encounter for screening for depression: Secondary | ICD-10-CM

## 2012-12-08 DIAGNOSIS — Z0000 Encounter for general adult medical examination without abnormal findings: Secondary | ICD-10-CM

## 2012-12-08 LAB — CBC WITH DIFFERENTIAL/PLATELET
Basophils Relative: 0.4 % (ref 0.0–3.0)
Eosinophils Relative: 1.2 % (ref 0.0–5.0)
HCT: 37.2 % (ref 36.0–46.0)
Lymphs Abs: 1.8 10*3/uL (ref 0.7–4.0)
MCV: 85 fl (ref 78.0–100.0)
Monocytes Absolute: 0.4 10*3/uL (ref 0.1–1.0)
Monocytes Relative: 7.4 % (ref 3.0–12.0)
RBC: 4.38 Mil/uL (ref 3.87–5.11)
WBC: 5.4 10*3/uL (ref 4.5–10.5)

## 2012-12-08 LAB — HEMOGLOBIN A1C: Hgb A1c MFr Bld: 6 % (ref 4.6–6.5)

## 2012-12-08 MED ORDER — MONTELUKAST SODIUM 10 MG PO TABS: 10.0000 mg | ORAL_TABLET | Freq: Every day | ORAL | Status: DC

## 2012-12-08 MED ORDER — LABETALOL HCL 200 MG PO TABS: 200.0000 mg | ORAL_TABLET | Freq: Two times a day (BID) | ORAL | Status: DC

## 2012-12-08 NOTE — Progress Notes (Signed)
  Subjective:    Patient ID: Amy Torres, female    DOB: 11-Feb-1976, 37 y.o.   MRN: [id number]  HPI CPE- UTD on GYN (2012)   Review of Systems Patient reports no vision/ hearing changes, adenopathy,fever, weight change,  persistant/recurrent hoarseness , swallowing issues, chest pain, palpitations, edema, persistant/recurrent cough, hemoptysis, dyspnea (rest/exertional/paroxysmal nocturnal), gastrointestinal bleeding (melena, rectal bleeding), abdominal pain, significant heartburn, bowel changes, GU symptoms (dysuria, hematuria, incontinence), Gyn symptoms (abnormal  bleeding, pain),  syncope, focal weakness, memory loss, numbness & tingling, skin/hair/nail changes, abnormal bruising or bleeding, anxiety, or depression.     Objective:   Physical Exam  General Appearance:    Alert, cooperative, no distress, appears stated age  Head:    Normocephalic, without obvious abnormality, atraumatic  Eyes:    PERRL, conjunctiva/corneas clear, EOM's intact, fundi    benign, both eyes  Ears:    Normal TM's and external ear canals, both ears  Nose:   Nares normal, septum midline, mucosa normal, no drainage    or sinus tenderness  Throat:   Lips, mucosa, and tongue normal; teeth and gums normal  Neck:   Supple, symmetrical, trachea midline, no adenopathy;    Thyroid: no enlargement/tenderness/nodules  Back:     Symmetric, no curvature, ROM normal, no CVA tenderness  Lungs:     Clear to auscultation bilaterally, respirations unlabored  Chest Wall:    No tenderness or deformity   Heart:    Regular rate and rhythm, S1 and S2 normal, no murmur, rub   or gallop  Breast Exam:    No tenderness, masses, or nipple abnormality  Abdomen:     Soft, non-tender, bowel sounds active all four quadrants,    no masses, no organomegaly  Genitalia:    Deferred to GYN  Rectal:    Extremities:   Extremities normal, atraumatic, no cyanosis or edema  Pulses:   2+ and symmetric all extremities  Skin:   Skin color,  texture, turgor normal, no rashes or lesions  Lymph nodes:   Cervical, supraclavicular, and axillary nodes normal  Neurologic:   CNII-XII intact, normal strength, sensation and reflexes    throughout          Assessment & Plan:

## 2012-12-08 NOTE — Assessment & Plan Note (Signed)
Pt's PE WNL.  UTD on pap- encouraged her to schedule for upcoming year.  Check labs.  Anticipatory guidance provided.

## 2012-12-08 NOTE — Patient Instructions (Addendum)
Follow up in 6-8 weeks to recheck BP STOP the Labetalol 300mg  and START the 200mg  twice daily We'll notify you of your lab results and make any changes if needed Keep up the good work!  You look great! Call and schedule with your GYN Happy Labor Day!

## 2012-12-09 LAB — BASIC METABOLIC PANEL
BUN: 14 mg/dL (ref 6–23)
Calcium: 8.9 mg/dL (ref 8.4–10.5)
Creatinine, Ser: 0.7 mg/dL (ref 0.4–1.2)
GFR: 107.99 mL/min (ref >60.00)
Glucose, Bld: 101 mg/dL — ABNORMAL HIGH (ref 70–99)
Sodium: 137 mEq/L (ref 135–145)

## 2012-12-09 LAB — LIPID PANEL
Cholesterol: 163 mg/dL (ref 0–200)
HDL: 38.8 mg/dL — ABNORMAL LOW (ref >39.00)
Triglycerides: 144 mg/dL (ref 0.0–149.0)

## 2012-12-09 LAB — HEPATIC FUNCTION PANEL
Albumin: 4.2 g/dL (ref 3.5–5.2)
Total Protein: 7.6 g/dL (ref 6.0–8.3)

## 2012-12-09 LAB — TSH: TSH: 1.12 u[IU]/mL (ref 0.35–5.50)

## 2012-12-10 ENCOUNTER — Telehealth: Payer: Self-pay | Admitting: Family Medicine

## 2012-12-10 NOTE — Telephone Encounter (Signed)
Patient is returning a missed call from yesterday. Did not see any documentation from anyone calling patient. Could have been about lab work? Please advise.

## 2012-12-10 NOTE — Telephone Encounter (Signed)
Called pt and gave her lab results

## 2012-12-11 LAB — VITAMIN D 1,25 DIHYDROXY: Vitamin D2 1, 25 (OH)2: <8 pg/mL

## 2013-01-26 ENCOUNTER — Encounter: Payer: Self-pay | Admitting: Family Medicine

## 2013-01-26 ENCOUNTER — Ambulatory Visit (INDEPENDENT_AMBULATORY_CARE_PROVIDER_SITE_OTHER): Payer: 59 | Admitting: Family Medicine

## 2013-01-26 VITALS — BP 120/72 | HR 64 | Temp 98.1°F | Resp 16 | Wt 176.2 lb

## 2013-01-26 DIAGNOSIS — I10 Essential (primary) hypertension: Secondary | ICD-10-CM

## 2013-01-26 DIAGNOSIS — Z23 Encounter for immunization: Secondary | ICD-10-CM

## 2013-01-26 DIAGNOSIS — L309 Dermatitis, unspecified: Secondary | ICD-10-CM | POA: Insufficient documentation

## 2013-01-26 DIAGNOSIS — L259 Unspecified contact dermatitis, unspecified cause: Secondary | ICD-10-CM

## 2013-01-26 MED ORDER — ALBUTEROL SULFATE HFA 108 (90 BASE) MCG/ACT IN AERS: 2.0000 | INHALATION_SPRAY | RESPIRATORY_TRACT | Status: DC | PRN

## 2013-01-26 MED ORDER — CLOTRIMAZOLE-BETAMETHASONE 1-0.05 % EX CREA: TOPICAL_CREAM | Freq: Two times a day (BID) | CUTANEOUS | Status: DC

## 2013-01-26 MED ORDER — LABETALOL HCL 200 MG PO TABS: 200.0000 mg | ORAL_TABLET | Freq: Two times a day (BID) | ORAL | Status: DC

## 2013-01-26 NOTE — Assessment & Plan Note (Signed)
Chronic problem.  Excellent control since decreasing meds.  Asymptomatic.  Will continue to follow.

## 2013-01-26 NOTE — Patient Instructions (Signed)
Schedule a follow up appt to recheck your sugars when you get back from your trip Continue the Labetalol twice daily Use the Lotrisone cream on your hand twice daily Call with any questions or concerns HAVE AN AMAZING TIME!!!

## 2013-01-26 NOTE — Assessment & Plan Note (Signed)
New.  Start combo steroid/antifungal cream.  Reviewed supportive care and red flags that should prompt return.  Pt expressed understanding and is in agreement w/ plan.

## 2013-01-26 NOTE — Progress Notes (Signed)
  Subjective:    Patient ID: Amy Torres, female    DOB: 12-13-1975, 37 y.o.   MRN: [id number]  HPI HTN- chronic problem, Labetalol was decreased from 300mg  to 200mg  BID.  Pt reports energy levels have improved.  No CP, SOB, HAs, visual changes, edema.  R hand dermatitis- has used hydrocortisone cream and antifungal meds w/out relief.  sxs x3 weeks.   Review of Systems For ROS see HPI     Objective:   Physical Exam  Vitals reviewed. Constitutional: She is oriented to person, place, and time. She appears well-developed and well-nourished. No distress.  HENT:  Head: Normocephalic and atraumatic.  Eyes: Conjunctivae and EOM are normal. Pupils are equal, round, and reactive to light.  Neck: Normal range of motion. Neck supple. No thyromegaly present.  Cardiovascular: Normal rate, regular rhythm, normal heart sounds and intact distal pulses.   No murmur heard. Pulmonary/Chest: Effort normal and breath sounds normal. No respiratory distress.  Abdominal: Soft. She exhibits no distension. There is no tenderness.  Musculoskeletal: She exhibits no edema.  Lymphadenopathy:    She has no cervical adenopathy.  Neurological: She is alert and oriented to person, place, and time.  Skin: Skin is warm and dry. There is erythema (area of contact dermatitis vs ring worm of finger webbing between R index and middle finger).  Psychiatric: She has a normal mood and affect. Her behavior is normal.          Assessment & Plan:

## 2013-03-04 ENCOUNTER — Encounter (HOSPITAL_COMMUNITY): Payer: Self-pay | Admitting: Emergency Medicine

## 2013-03-04 ENCOUNTER — Emergency Department (HOSPITAL_COMMUNITY)
Admission: EM | Admit: 2013-03-04 | Discharge: 2013-03-05 | Disposition: A | Discharge status: Home / Self Care | Payer: 59 | Attending: Emergency Medicine | Admitting: Emergency Medicine

## 2013-03-04 DIAGNOSIS — Z9889 Other specified postprocedural states: Secondary | ICD-10-CM | POA: Insufficient documentation

## 2013-03-04 DIAGNOSIS — R509 Fever, unspecified: Secondary | ICD-10-CM

## 2013-03-04 DIAGNOSIS — B9789 Other viral agents as the cause of diseases classified elsewhere: Secondary | ICD-10-CM | POA: Insufficient documentation

## 2013-03-04 DIAGNOSIS — I10 Essential (primary) hypertension: Secondary | ICD-10-CM | POA: Insufficient documentation

## 2013-03-04 DIAGNOSIS — R112 Nausea with vomiting, unspecified: Secondary | ICD-10-CM | POA: Insufficient documentation

## 2013-03-04 DIAGNOSIS — Z79899 Other long term (current) drug therapy: Secondary | ICD-10-CM | POA: Insufficient documentation

## 2013-03-04 DIAGNOSIS — Z8632 Personal history of gestational diabetes: Secondary | ICD-10-CM | POA: Insufficient documentation

## 2013-03-04 DIAGNOSIS — J45909 Unspecified asthma, uncomplicated: Secondary | ICD-10-CM | POA: Insufficient documentation

## 2013-03-04 DIAGNOSIS — B349 Viral infection, unspecified: Secondary | ICD-10-CM

## 2013-03-04 NOTE — ED Notes (Signed)
Patient is alert and oriented x3.  She is complaining of a fever and vomiting that started today. She states that she has two small children that both have been sick.  Currently she descrbes  Her pain at 8 of 10.

## 2013-03-05 LAB — URINALYSIS, ROUTINE W REFLEX MICROSCOPIC
Bilirubin Urine: NEGATIVE
Ketones, ur: NEGATIVE mg/dL
Leukocytes, UA: NEGATIVE
Nitrite: NEGATIVE
Protein, ur: NEGATIVE mg/dL
Urobilinogen, UA: 0.2 mg/dL (ref 0.0–1.0)
pH: 5.5 (ref 5.0–8.0)

## 2013-03-05 LAB — COMPREHENSIVE METABOLIC PANEL
ALT: 18 U/L (ref 0–35)
AST: 16 U/L (ref 0–37)
Albumin: 4 g/dL (ref 3.5–5.2)
BUN: 10 mg/dL (ref 6–23)
CO2: 22 mEq/L (ref 19–32)
Calcium: 8.4 mg/dL (ref 8.4–10.5)
Creatinine, Ser: 0.62 mg/dL (ref 0.50–1.10)
GFR calc non Af Amer: >90 mL/min (ref >90)
Sodium: 133 mEq/L — ABNORMAL LOW (ref 135–145)
Total Bilirubin: 0.4 mg/dL (ref 0.3–1.2)
Total Protein: 7.7 g/dL (ref 6.0–8.3)

## 2013-03-05 LAB — URINE MICROSCOPIC-ADD ON

## 2013-03-05 LAB — CBC WITH DIFFERENTIAL/PLATELET
Basophils Absolute: 0 10*3/uL (ref 0.0–0.1)
Basophils Relative: 0 % (ref 0–1)
Eosinophils Relative: 0 % (ref 0–5)
Lymphocytes Relative: 11 % — ABNORMAL LOW (ref 12–46)
MCV: 83.5 fL (ref 78.0–100.0)
Monocytes Absolute: 1 10*3/uL (ref 0.1–1.0)
Platelets: 264 10*3/uL (ref 150–400)
RBC: 4.72 MIL/uL (ref 3.87–5.11)
RDW: 12.4 % (ref 11.5–15.5)
WBC: 8.7 10*3/uL (ref 4.0–10.5)

## 2013-03-05 LAB — LIPASE, BLOOD: Lipase: 24 U/L (ref 11–59)

## 2013-03-05 MED ORDER — PROMETHAZINE HCL 25 MG RE SUPP: 25.0000 mg | Freq: Four times a day (QID) | RECTAL | Status: DC | PRN

## 2013-03-05 MED ORDER — ONDANSETRON 8 MG PO TBDP: 8.0000 mg | ORAL_TABLET | Freq: Three times a day (TID) | ORAL | Status: DC | PRN

## 2013-03-05 MED ORDER — ACETAMINOPHEN 325 MG PO TABS
650.0000 mg | ORAL_TABLET | Freq: Once | ORAL | Status: AC
  Administered 2013-03-05: 650 mg via ORAL
  Filled 2013-03-05: qty 2

## 2013-03-05 MED ORDER — DICYCLOMINE HCL 10 MG/ML IM SOLN
20.0000 mg | Freq: Once | INTRAMUSCULAR | Status: AC
  Administered 2013-03-05: 20 mg via INTRAMUSCULAR

## 2013-03-05 MED ORDER — SODIUM CHLORIDE 0.9 % IV BOLUS (SEPSIS)
1000.0000 mL | Freq: Once | INTRAVENOUS | Status: AC
  Administered 2013-03-05: 1000 mL via INTRAVENOUS

## 2013-03-05 MED ORDER — PROMETHAZINE HCL 25 MG/ML IJ SOLN
12.5000 mg | Freq: Once | INTRAMUSCULAR | Status: AC
  Administered 2013-03-05: 25 mg via INTRAVENOUS
  Filled 2013-03-05: qty 1

## 2013-03-05 MED ORDER — PROMETHAZINE HCL 25 MG PO TABS: 25.0000 mg | ORAL_TABLET | Freq: Four times a day (QID) | ORAL | Status: DC | PRN

## 2013-03-05 MED ORDER — ONDANSETRON 8 MG PO TBDP
8.0000 mg | ORAL_TABLET | Freq: Once | ORAL | Status: AC
  Administered 2013-03-05: 8 mg via ORAL
  Filled 2013-03-05: qty 1

## 2013-03-05 MED ORDER — PROMETHAZINE HCL 25 MG/ML IJ SOLN: 12.5000 mg | Freq: Once | INTRAMUSCULAR | Status: DC

## 2013-03-05 MED ORDER — IBUPROFEN 800 MG PO TABS
800.0000 mg | ORAL_TABLET | Freq: Once | ORAL | Status: AC
  Administered 2013-03-05: 800 mg via ORAL
  Filled 2013-03-05: qty 1

## 2013-03-05 NOTE — ED Provider Notes (Signed)
CSN: [id number]     Arrival date & time 03/04/13  2327 History   First MD Initiated Contact with Patient 03/05/13 0007     Chief Complaint  Patient presents with  . Fever  . Emesis   (Consider location/radiation/quality/duration/timing/severity/associated sxs/prior Treatment) HPI 37 year old female presents to emergency apartment from home with complaint of low-grade fever, nausea and vomiting.  She has 2 daughters with similar symptoms.  She began feeling bad today around 4 PM.  She complains of hot and cold flashes, and body aches.  She has diffuse abdominal pain, just prior to vomiting.  No travel, no sick contacts other than her daughters, no unusual foods.  Past Medical History  Diagnosis Date  . Asthma   . Hypertension   . Miscarriage     12/11  . Diabetes mellitus     borderline gestational  . Pregnancy, complicated 12/31/2010  . Previous cesarean delivery, delivered 01/01/2011   Past Surgical History  Procedure Laterality Date  . Breast cyst excision      left breast  . Cesarean section     Family History  Problem Relation Age of Onset  . Hypertension Mother   . Stroke Mother   . Cancer Maternal Aunt     breast cancer  . Cancer Paternal Aunt     breast cancer   History  Substance Use Topics  . Smoking status: Never Smoker   . Smokeless tobacco: Not on file  . Alcohol Use: No   OB History   Grav Para Term Preterm Abortions TAB SAB Ect Mult Living   3 2 2  0 1 0 1 0 0 2     Review of Systems  See History of Present Illness; otherwise all other systems are reviewed and negative  Allergies  Review of patient's allergies indicates no known allergies.  Home Medications   Current Outpatient Rx  Name  Route  Sig  Dispense  Refill  . acetaminophen (TYLENOL) 500 MG tablet   Oral   Take 500 mg by mouth every 6 (six) hours as needed for mild pain. Patient takes medication for pain.         Marland Kitchen albuterol (PROVENTIL HFA;VENTOLIN HFA) 108 (90 BASE) MCG/ACT  inhaler   Inhalation   Inhale 2 puffs into the lungs every 4 (four) hours as needed for wheezing or shortness of breath.   3 Inhaler   3   . ibuprofen (ADVIL,MOTRIN) 200 MG tablet   Oral   Take 400 mg by mouth every 6 (six) hours as needed for moderate pain.         Marland Kitchen labetalol (NORMODYNE) 200 MG tablet   Oral   Take 1 tablet (200 mg total) by mouth 2 (two) times daily.   180 tablet   3   . montelukast (SINGULAIR) 10 MG tablet   Oral   Take 1 tablet (10 mg total) by mouth at bedtime.   30 tablet   6    BP 148/80  Pulse 99  Temp(Src) 100 F (37.8 C) (Oral)  Resp 14  SpO2 97%  LMP 03/04/2013 Physical Exam  Nursing note and vitals reviewed. Constitutional: She is oriented to person, place, and time. She appears well-developed and well-nourished.  Uncomfortable appearing, hot to the touch  HENT:  Head: Normocephalic and atraumatic.  Right Ear: External ear normal.  Left Ear: External ear normal.  Nose: Nose normal.  Mouth/Throat: Oropharynx is clear and moist.  Eyes: Conjunctivae and EOM are normal. Pupils are equal,  round, and reactive to light.  Neck: Normal range of motion. Neck supple. No JVD present. No tracheal deviation present. No thyromegaly present.  Cardiovascular: Normal rate, regular rhythm, normal heart sounds and intact distal pulses.  Exam reveals no gallop and no friction rub.   No murmur heard. Pulmonary/Chest: Effort normal and breath sounds normal. No stridor. No respiratory distress. She has no wheezes. She has no rales. She exhibits no tenderness.  Abdominal: Soft. She exhibits no distension and no mass. There is no tenderness. There is no rebound and no guarding.  Hyperactive bowel sounds  Musculoskeletal: Normal range of motion. She exhibits no edema and no tenderness.  Lymphadenopathy:    She has no cervical adenopathy.  Neurological: She is alert and oriented to person, place, and time. She exhibits normal muscle tone. Coordination normal.   Skin: Skin is dry. No rash noted. No erythema. No pallor.  Psychiatric: She has a normal mood and affect. Her behavior is normal. Judgment and thought content normal.    ED Course  Procedures (including critical care time) Labs Review Labs Reviewed  URINALYSIS, ROUTINE W REFLEX MICROSCOPIC - Abnormal; Notable for the following:    Hgb urine dipstick SMALL (*)    All other components within normal limits  CBC WITH DIFFERENTIAL - Abnormal; Notable for the following:    Neutrophils Relative % 78 (*)    Lymphocytes Relative 11 (*)    All other components within normal limits  COMPREHENSIVE METABOLIC PANEL - Abnormal; Notable for the following:    Sodium 133 (*)    Potassium 3.2 (*)    Glucose, Bld 138 (*)    All other components within normal limits  URINE MICROSCOPIC-ADD ON  LIPASE, BLOOD   Imaging Review No results found.  EKG Interpretation   None       MDM   1. Fever   2. Nausea and vomiting   3. Viral syndrome    37 year old female with low-grade fever, nausea and vomiting.  Suspect viral etiology.  Will give Tylenol, ODT Zofran, and check UA  4:15 AM Pt feeling better after IVF.  Labs unremarkable.  Fever improved after ibuprofen.    Olivia Mackie, MD 03/05/13 [phone]

## 2013-07-06 ENCOUNTER — Encounter: Payer: Self-pay | Admitting: Family Medicine

## 2013-07-06 ENCOUNTER — Ambulatory Visit (INDEPENDENT_AMBULATORY_CARE_PROVIDER_SITE_OTHER): Payer: 59 | Admitting: Family Medicine

## 2013-07-06 VITALS — BP 140/100 | HR 77 | Temp 98.2°F | Resp 16 | Wt 181.2 lb

## 2013-07-06 DIAGNOSIS — Z1231 Encounter for screening mammogram for malignant neoplasm of breast: Secondary | ICD-10-CM

## 2013-07-06 DIAGNOSIS — J019 Acute sinusitis, unspecified: Secondary | ICD-10-CM

## 2013-07-06 DIAGNOSIS — J45901 Unspecified asthma with (acute) exacerbation: Secondary | ICD-10-CM

## 2013-07-06 MED ORDER — PROMETHAZINE-DM 6.25-15 MG/5ML PO SYRP: 5.0000 mL | ORAL_SOLUTION | Freq: Four times a day (QID) | ORAL | Status: DC | PRN

## 2013-07-06 MED ORDER — IPRATROPIUM-ALBUTEROL 0.5-2.5 (3) MG/3ML IN SOLN
3.0000 mL | Freq: Once | RESPIRATORY_TRACT | Status: AC
  Administered 2013-07-06: 3 mL via RESPIRATORY_TRACT

## 2013-07-06 MED ORDER — AMOXICILLIN 875 MG PO TABS: 875.0000 mg | ORAL_TABLET | Freq: Two times a day (BID) | ORAL | Status: DC

## 2013-07-06 NOTE — Progress Notes (Signed)
Pre visit review using our clinic review tool, if applicable. No additional management support is needed unless otherwise documented below in the visit note. 

## 2013-07-06 NOTE — Assessment & Plan Note (Signed)
New.  Due to seasonal allergies.  Improved s/p duo neb.  Start abx.  Restart Qvar until feeling better.  Reviewed supportive care and red flags that should prompt return.  Pt expressed understanding and is in agreement w/ plan.

## 2013-07-06 NOTE — Patient Instructions (Signed)
Follow up as needed Start the Amoxicillin twice daily- take w/ food Use the cough syrup as needed- will cause drowsiness Start the Qvar- 1 puff twice daily- until feeling better Albuterol as needed for cough, shortness of breath, wheezing Call with any questions or concerns Hang in there!!!

## 2013-07-06 NOTE — Assessment & Plan Note (Signed)
Pt's sxs and PE consistent w/ infxn.  Start abx.  Reviewed supportive care and red flags that should prompt return.  Pt expressed understanding and is in agreement w/ plan.  

## 2013-07-06 NOTE — Progress Notes (Signed)
   Subjective:    Patient ID: Amy Torres, female    DOB: 1976/03/25, 38 y.o.   MRN: [id number]  Asthma She complains of shortness of breath and wheezing. Her past medical history is significant for asthma.  Wheezing  Associated symptoms include shortness of breath. Her past medical history is significant for asthma.  Shortness of Breath Associated symptoms include wheezing. Her past medical history is significant for asthma.   Allergic rhinitis/asthma- sxs started yesterday, using albuterol q3 hrs for wheezing, SOB.  + cough.  + nasal congestion.  No fevers.  No sinus pain.  Currently on Claritin and Singulair.   Review of Systems  Respiratory: Positive for shortness of breath and wheezing.    For ROS see HPI     Objective:   Physical Exam  Vitals reviewed. Constitutional: She appears well-developed and well-nourished. No distress.  HENT:  Head: Normocephalic and atraumatic.  Right Ear: Tympanic membrane normal.  Left Ear: Tympanic membrane normal.  Nose: Mucosal edema and rhinorrhea present. Right sinus exhibits maxillary sinus tenderness. Right sinus exhibits no frontal sinus tenderness. Left sinus exhibits maxillary sinus tenderness. Left sinus exhibits no frontal sinus tenderness.  Mouth/Throat: Uvula is midline and mucous membranes are normal. Posterior oropharyngeal erythema present. No oropharyngeal exudate.  Eyes: Conjunctivae and EOM are normal. Pupils are equal, round, and reactive to light.  Neck: Normal range of motion. Neck supple.  Cardiovascular: Normal rate, regular rhythm and normal heart sounds.   Pulmonary/Chest: Effort normal. No respiratory distress. She has wheezes (scattered expiratory wheezes- improved s/p neb tx).  Lymphadenopathy:    She has no cervical adenopathy.          Assessment & Plan:

## 2013-07-13 LAB — HM MAMMOGRAPHY

## 2013-08-05 ENCOUNTER — Ambulatory Visit
Admission: RE | Admit: 2013-08-05 | Discharge: 2013-08-05 | Disposition: A | Discharge status: Home / Self Care | Payer: 59 | Source: Ambulatory Visit | Attending: Family Medicine | Admitting: Family Medicine

## 2013-08-05 ENCOUNTER — Encounter (INDEPENDENT_AMBULATORY_CARE_PROVIDER_SITE_OTHER): Payer: Self-pay

## 2013-08-05 DIAGNOSIS — Z1231 Encounter for screening mammogram for malignant neoplasm of breast: Secondary | ICD-10-CM

## 2013-08-09 ENCOUNTER — Other Ambulatory Visit: Payer: Self-pay | Admitting: Family Medicine

## 2013-08-09 DIAGNOSIS — R928 Other abnormal and inconclusive findings on diagnostic imaging of breast: Secondary | ICD-10-CM

## 2013-08-23 ENCOUNTER — Ambulatory Visit
Admission: RE | Admit: 2013-08-23 | Discharge: 2013-08-23 | Disposition: A | Discharge status: Home / Self Care | Payer: 59 | Source: Ambulatory Visit | Attending: Family Medicine | Admitting: Family Medicine

## 2013-08-23 DIAGNOSIS — R928 Other abnormal and inconclusive findings on diagnostic imaging of breast: Secondary | ICD-10-CM

## 2013-10-12 LAB — HM PAP SMEAR: HM PAP: NORMAL

## 2013-11-21 ENCOUNTER — Ambulatory Visit (INDEPENDENT_AMBULATORY_CARE_PROVIDER_SITE_OTHER): Payer: 59 | Admitting: Family Medicine

## 2013-11-21 ENCOUNTER — Encounter: Payer: Self-pay | Admitting: Family Medicine

## 2013-11-21 VITALS — BP 110/82 | HR 80 | Temp 97.7°F | Wt 184.2 lb

## 2013-11-21 DIAGNOSIS — J019 Acute sinusitis, unspecified: Secondary | ICD-10-CM

## 2013-11-21 DIAGNOSIS — R928 Other abnormal and inconclusive findings on diagnostic imaging of breast: Secondary | ICD-10-CM | POA: Insufficient documentation

## 2013-11-21 MED ORDER — FLUCONAZOLE 150 MG PO TABS: 150.0000 mg | ORAL_TABLET | Freq: Once | ORAL | Status: AC

## 2013-11-21 MED ORDER — AMOXICILLIN 875 MG PO TABS: 875.0000 mg | ORAL_TABLET | Freq: Two times a day (BID) | ORAL | Status: DC

## 2013-11-21 MED ORDER — PROMETHAZINE-DM 6.25-15 MG/5ML PO SYRP: 5.0000 mL | ORAL_SOLUTION | Freq: Four times a day (QID) | ORAL | Status: DC | PRN

## 2013-11-21 NOTE — Progress Notes (Signed)
   Subjective:    Patient ID: Amy Torres, female    DOB: [birth year], 38 y.o.   MRN: [id number]  Cough   URI- sxs started 4 days ago.  initially thought it was allergies.  Asthma worsened.  Developed chills.  + fever, Tm 101.1  + maxillary sinus pain.  R ear pain.  Cough is productive.  + nausea, no vomiting/diarrhea.  + sick contacts.   Review of Systems  Respiratory: Positive for cough.    For ROS see HPI     Objective:   Physical Exam  Vitals reviewed. Constitutional: She appears well-developed and well-nourished. No distress.  HENT:  Head: Normocephalic and atraumatic.  Right Ear: Tympanic membrane normal.  Left Ear: Tympanic membrane normal.  Nose: Mucosal edema and rhinorrhea present. Right sinus exhibits maxillary sinus tenderness and frontal sinus tenderness. Left sinus exhibits maxillary sinus tenderness and frontal sinus tenderness.  Mouth/Throat: Uvula is midline and mucous membranes are normal. Posterior oropharyngeal erythema present. No oropharyngeal exudate.  Eyes: Conjunctivae and EOM are normal. Pupils are equal, round, and reactive to light.  Neck: Normal range of motion. Neck supple.  Cardiovascular: Normal rate, regular rhythm and normal heart sounds.   Pulmonary/Chest: Effort normal and breath sounds normal. No respiratory distress. She has no wheezes.  Lymphadenopathy:    She has no cervical adenopathy.          Assessment & Plan:

## 2013-11-21 NOTE — Assessment & Plan Note (Signed)
Pt's sxs and PE consistent w/ infxn.  Start abx.  Pt has hx of vaginitis w/ abx- Diflucan script sent.  Cough meds prn.  Reviewed supportive care and red flags that should prompt return.  Pt expressed understanding and is in agreement w/ plan.

## 2013-11-21 NOTE — Assessment & Plan Note (Signed)
New.  Reviewed results w/ pt.  Plan for now is to repeat imaging in 6 months and if any concern at that time, we discussed proceeding w/ bx for definitive dx.  Pt expressed understanding and is in agreement w/ plan.

## 2013-11-21 NOTE — Patient Instructions (Signed)
Schedule your complete physical at your convenience Start the Amoxicillin twice daily (w/ food) for the sinus infection Drink plenty of fluids Use the cough syrup as needed- will cause drowsiness Mucinex DM for daytime cough REST! Take the Diflucan as needed if yeast develops Call with any questions or concerns Hang in there!

## 2013-11-21 NOTE — Progress Notes (Signed)
Pre visit review using our clinic review tool, if applicable. No additional management support is needed unless otherwise documented below in the visit note. 

## 2013-12-05 ENCOUNTER — Encounter: Payer: Self-pay | Admitting: General Practice

## 2013-12-12 ENCOUNTER — Telehealth: Payer: Self-pay

## 2013-12-12 NOTE — Telephone Encounter (Signed)
Scheduled pt cpe for October 1st.

## 2013-12-12 NOTE — Telephone Encounter (Signed)
LVM for pt to call back.   Needs CPE and dm bundle pt.

## 2014-01-12 ENCOUNTER — Ambulatory Visit (INDEPENDENT_AMBULATORY_CARE_PROVIDER_SITE_OTHER): Payer: 59 | Admitting: Family Medicine

## 2014-01-12 ENCOUNTER — Encounter: Payer: Self-pay | Admitting: Family Medicine

## 2014-01-12 VITALS — BP 112/80 | HR 80 | Temp 97.9°F | Resp 16 | Ht 62.0 in | Wt 183.5 lb

## 2014-01-12 DIAGNOSIS — E785 Hyperlipidemia, unspecified: Secondary | ICD-10-CM

## 2014-01-12 DIAGNOSIS — M25572 Pain in left ankle and joints of left foot: Secondary | ICD-10-CM

## 2014-01-12 DIAGNOSIS — Z23 Encounter for immunization: Secondary | ICD-10-CM

## 2014-01-12 DIAGNOSIS — Z0000 Encounter for general adult medical examination without abnormal findings: Secondary | ICD-10-CM

## 2014-01-12 DIAGNOSIS — Z8632 Personal history of gestational diabetes: Secondary | ICD-10-CM

## 2014-01-12 LAB — HEPATIC FUNCTION PANEL
ALT: 28 U/L (ref 0–35)
AST: 27 U/L (ref 0–37)
Albumin: 4.2 g/dL (ref 3.5–5.2)
Alkaline Phosphatase: 59 U/L (ref 39–117)
BILIRUBIN DIRECT: 0.1 mg/dL (ref 0.0–0.3)
BILIRUBIN TOTAL: 0.9 mg/dL (ref 0.2–1.2)
Total Protein: 7.8 g/dL (ref 6.0–8.3)

## 2014-01-12 LAB — CBC WITH DIFFERENTIAL/PLATELET
Basophils Absolute: 0 10*3/uL (ref 0.0–0.1)
Basophils Relative: 0 % (ref 0.0–3.0)
EOS PCT: 1.2 % (ref 0.0–5.0)
Eosinophils Absolute: 0.1 10*3/uL (ref 0.0–0.7)
HCT: 39.1 % (ref 36.0–46.0)
Hemoglobin: 13.3 g/dL (ref 12.0–15.0)
LYMPHS ABS: 1.3 10*3/uL (ref 0.7–4.0)
Lymphocytes Relative: 11.8 % — ABNORMAL LOW (ref 12.0–46.0)
MCHC: 33.9 g/dL (ref 30.0–36.0)
MCV: 86.9 fl (ref 78.0–100.0)
MONO ABS: 0.7 10*3/uL (ref 0.1–1.0)
Monocytes Relative: 6.5 % (ref 3.0–12.0)
Neutro Abs: 8.5 10*3/uL — ABNORMAL HIGH (ref 1.4–7.7)
Neutrophils Relative %: 80.5 % — ABNORMAL HIGH (ref 43.0–77.0)
PLATELETS: 258 10*3/uL (ref 150.0–400.0)
RBC: 4.5 Mil/uL (ref 3.87–5.11)
RDW: 12.9 % (ref 11.5–15.5)
WBC: 10.6 10*3/uL — AB (ref 4.0–10.5)

## 2014-01-12 LAB — LDL CHOLESTEROL, DIRECT: Direct LDL: 108.1 mg/dL

## 2014-01-12 LAB — BASIC METABOLIC PANEL
BUN: 10 mg/dL (ref 6–23)
CO2: 25 mEq/L (ref 19–32)
Calcium: 8.8 mg/dL (ref 8.4–10.5)
Chloride: 104 mEq/L (ref 96–112)
Creatinine, Ser: 0.7 mg/dL (ref 0.4–1.2)
GFR: 93.92 mL/min (ref >60.00)
Glucose, Bld: 105 mg/dL — ABNORMAL HIGH (ref 70–99)
POTASSIUM: 3.7 meq/L (ref 3.5–5.1)
SODIUM: 137 meq/L (ref 135–145)

## 2014-01-12 LAB — LIPID PANEL
CHOL/HDL RATIO: 5
Cholesterol: 181 mg/dL (ref 0–200)
HDL: 38.4 mg/dL — ABNORMAL LOW (ref >39.00)
NonHDL: 142.6
Triglycerides: 202 mg/dL — ABNORMAL HIGH (ref 0.0–149.0)
VLDL: 40.4 mg/dL — ABNORMAL HIGH (ref 0.0–40.0)

## 2014-01-12 LAB — VITAMIN D 25 HYDROXY (VIT D DEFICIENCY, FRACTURES): VITD: 19.33 ng/mL — AB (ref 30.00–100.00)

## 2014-01-12 LAB — HEMOGLOBIN A1C: Hgb A1c MFr Bld: 6.1 % (ref 4.6–6.5)

## 2014-01-12 NOTE — Patient Instructions (Signed)
Follow up in 6 months to recheck BP We'll notify you of your lab results and make any changes if needed Keep up the good work on healthy diet and regular exercise Call with any questions or concerns Happy Fall!

## 2014-01-12 NOTE — Assessment & Plan Note (Signed)
Pt's PE WNL w/ exception of obesity.  Check labs.  Anticipatory guidance provided.  

## 2014-01-12 NOTE — Progress Notes (Signed)
   Subjective:    Patient ID: Amy Torres, female    DOB: 03/06/1976, 38 y.o.   MRN: [id number]  HPI CPE- no current concerns today.  [place], UTD on pap  Soft tissue swelling posterior to R lateral malleous.  TTP.  Enlarging.    Review of Systems Patient reports no vision/ hearing changes, adenopathy,fever, weight change,  persistant/recurrent hoarseness , swallowing issues, chest pain, palpitations, edema, persistant/recurrent cough, hemoptysis, dyspnea (rest/exertional/paroxysmal nocturnal), gastrointestinal bleeding (melena, rectal bleeding), abdominal pain, significant heartburn, bowel changes, GU symptoms (dysuria, hematuria, incontinence), Gyn symptoms (abnormal  bleeding, pain),  syncope, focal weakness, memory loss, numbness & tingling, skin/hair/nail changes, abnormal bruising or bleeding, anxiety, or depression.     Objective:   Physical Exam General Appearance:    Alert, cooperative, no distress, appears stated age, obese  Head:    Normocephalic, without obvious abnormality, atraumatic  Eyes:    PERRL, conjunctiva/corneas clear, EOM's intact, fundi    benign, both eyes  Ears:    Normal TM's and external ear canals, both ears  Nose:   Nares normal, septum midline, mucosa normal, no drainage    or sinus tenderness  Throat:   Lips, mucosa, and tongue normal; teeth and gums normal  Neck:   Supple, symmetrical, trachea midline, no adenopathy;    Thyroid: no enlargement/tenderness/nodules  Back:     Symmetric, no curvature, ROM normal, no CVA tenderness  Lungs:     Clear to auscultation bilaterally, respirations unlabored  Chest Wall:    No tenderness or deformity   Heart:    Regular rate and rhythm, S1 and S2 normal, no murmur, rub   or gallop  Breast Exam:    Deferred to GYN  Abdomen:     Soft, non-tender, bowel sounds active all four quadrants,    no masses, no organomegaly  Genitalia:    Deferred to GYN  Rectal:    Extremities:   Extremities normal, atraumatic,  no cyanosis or edema  Pulses:   2+ and symmetric all extremities  Skin:   Skin color, texture, turgor normal, no rashes or lesions  Lymph nodes:   Cervical, supraclavicular, and axillary nodes normal  Neurologic:   CNII-XII intact, normal strength, sensation and reflexes    throughout          Assessment & Plan:

## 2014-01-12 NOTE — Assessment & Plan Note (Signed)
Check A1C to assess for progression into diabetes.  Discussed healthy, low carb diet and regular exercise.  Will continue to follow.

## 2014-01-12 NOTE — Assessment & Plan Note (Signed)
Pt w/ apparent soft tissue mass/swelling posterior to R lateral malleolus.  Refer to ortho.  Pt expressed understanding and is in agreement w/ plan.

## 2014-01-12 NOTE — Progress Notes (Signed)
Pre visit review using our clinic review tool, if applicable. No additional management support is needed unless otherwise documented below in the visit note. 

## 2014-01-13 LAB — TSH: TSH: 1.47 u[IU]/mL (ref 0.35–4.50)

## 2014-01-16 ENCOUNTER — Other Ambulatory Visit: Payer: Self-pay | Admitting: General Practice

## 2014-01-16 MED ORDER — VITAMIN D (ERGOCALCIFEROL) 1.25 MG (50000 UNIT) PO CAPS: 50000.0000 [IU] | ORAL_CAPSULE | ORAL | Status: DC

## 2014-02-01 ENCOUNTER — Other Ambulatory Visit: Payer: Self-pay | Admitting: Family Medicine

## 2014-02-01 DIAGNOSIS — N63 Unspecified lump in unspecified breast: Secondary | ICD-10-CM

## 2014-02-09 ENCOUNTER — Other Ambulatory Visit: Payer: Self-pay | Admitting: General Practice

## 2014-02-09 MED ORDER — LABETALOL HCL 200 MG PO TABS: 200.0000 mg | ORAL_TABLET | Freq: Two times a day (BID) | ORAL | Status: DC

## 2014-02-13 ENCOUNTER — Encounter: Payer: Self-pay | Admitting: Family Medicine

## 2014-02-23 ENCOUNTER — Ambulatory Visit
Admission: RE | Admit: 2014-02-23 | Discharge: 2014-02-23 | Disposition: A | Discharge status: Home / Self Care | Payer: 59 | Source: Ambulatory Visit | Attending: Family Medicine | Admitting: Family Medicine

## 2014-02-23 DIAGNOSIS — N63 Unspecified lump in unspecified breast: Secondary | ICD-10-CM

## 2014-04-17 ENCOUNTER — Other Ambulatory Visit: Payer: Self-pay | Admitting: General Practice

## 2014-04-17 MED ORDER — ALBUTEROL SULFATE HFA 108 (90 BASE) MCG/ACT IN AERS: 2.0000 | INHALATION_SPRAY | RESPIRATORY_TRACT | Status: DC | PRN

## 2014-05-15 LAB — HM DIABETES EYE EXAM

## 2014-07-14 ENCOUNTER — Encounter: Payer: Self-pay | Admitting: General Practice

## 2014-07-14 ENCOUNTER — Ambulatory Visit (INDEPENDENT_AMBULATORY_CARE_PROVIDER_SITE_OTHER): Payer: 59 | Admitting: Family Medicine

## 2014-07-14 ENCOUNTER — Encounter: Payer: Self-pay | Admitting: Family Medicine

## 2014-07-14 VITALS — BP 112/80 | HR 70 | Temp 98.2°F | Resp 16 | Wt 181.1 lb

## 2014-07-14 DIAGNOSIS — I10 Essential (primary) hypertension: Secondary | ICD-10-CM

## 2014-07-14 DIAGNOSIS — E781 Pure hyperglyceridemia: Secondary | ICD-10-CM

## 2014-07-14 LAB — CBC WITH DIFFERENTIAL/PLATELET
BASOS ABS: 0 10*3/uL (ref 0.0–0.1)
Basophils Relative: 0.4 % (ref 0.0–3.0)
EOS ABS: 0.1 10*3/uL (ref 0.0–0.7)
Eosinophils Relative: 1.2 % (ref 0.0–5.0)
HCT: 39.8 % (ref 36.0–46.0)
Hemoglobin: 13.5 g/dL (ref 12.0–15.0)
LYMPHS PCT: 32.1 % (ref 12.0–46.0)
Lymphs Abs: 2.4 10*3/uL (ref 0.7–4.0)
MCHC: 33.9 g/dL (ref 30.0–36.0)
MCV: 85 fl (ref 78.0–100.0)
Monocytes Absolute: 0.5 10*3/uL (ref 0.1–1.0)
Monocytes Relative: 7.3 % (ref 3.0–12.0)
NEUTROS PCT: 59 % (ref 43.0–77.0)
Neutro Abs: 4.4 10*3/uL (ref 1.4–7.7)
Platelets: 282 10*3/uL (ref 150.0–400.0)
RBC: 4.69 Mil/uL (ref 3.87–5.11)
RDW: 13 % (ref 11.5–15.5)
WBC: 7.5 10*3/uL (ref 4.0–10.5)

## 2014-07-14 LAB — BASIC METABOLIC PANEL
BUN: 11 mg/dL (ref 6–23)
CHLORIDE: 103 meq/L (ref 96–112)
CO2: 28 mEq/L (ref 19–32)
Calcium: 9.2 mg/dL (ref 8.4–10.5)
Creatinine, Ser: 0.69 mg/dL (ref 0.40–1.20)
GFR: 99.98 mL/min (ref >60.00)
GLUCOSE: 99 mg/dL (ref 70–99)
Potassium: 3.7 mEq/L (ref 3.5–5.1)
Sodium: 137 mEq/L (ref 135–145)

## 2014-07-14 LAB — LIPID PANEL
Cholesterol: 188 mg/dL (ref 0–200)
HDL: 39.2 mg/dL (ref >39.00)
NonHDL: 148.8
TRIGLYCERIDES: 210 mg/dL — AB (ref 0.0–149.0)
Total CHOL/HDL Ratio: 5
VLDL: 42 mg/dL — ABNORMAL HIGH (ref 0.0–40.0)

## 2014-07-14 LAB — HEPATIC FUNCTION PANEL
ALK PHOS: 52 U/L (ref 39–117)
ALT: 23 U/L (ref 0–35)
AST: 19 U/L (ref 0–37)
Albumin: 4.3 g/dL (ref 3.5–5.2)
Bilirubin, Direct: 0.1 mg/dL (ref 0.0–0.3)
Total Bilirubin: 0.6 mg/dL (ref 0.2–1.2)
Total Protein: 7.4 g/dL (ref 6.0–8.3)

## 2014-07-14 LAB — LDL CHOLESTEROL, DIRECT: Direct LDL: 120 mg/dL

## 2014-07-14 NOTE — Progress Notes (Signed)
Pre visit review using our clinic review tool, if applicable. No additional management support is needed unless otherwise documented below in the visit note. 

## 2014-07-14 NOTE — Progress Notes (Signed)
   Subjective:    Patient ID: Amy Torres, female    DOB: 09-27-1975, 39 y.o.   MRN: [id number]  HPI HTN- chronic problem, on Labetalol.  Excellent BP control.  No CP, SOB, HAs, visual changes, edema.  Pt has lost 4 lbs since last visit.  Applauded her efforts.  Hyperlipidemia- hx of elevated trigs.  Attempting to control w/ healthy diet and exercise.  Denies abd pain, N/V.   Review of Systems For ROS see HPI     Objective:   Physical Exam  Constitutional: She is oriented to person, place, and time. She appears well-developed and well-nourished. No distress.  HENT:  Head: Normocephalic and atraumatic.  Eyes: Conjunctivae and EOM are normal. Pupils are equal, round, and reactive to light.  Neck: Normal range of motion. Neck supple. No thyromegaly present.  Cardiovascular: Normal rate, regular rhythm, normal heart sounds and intact distal pulses.   No murmur heard. Pulmonary/Chest: Effort normal and breath sounds normal. No respiratory distress.  Abdominal: Soft. She exhibits no distension. There is no tenderness.  Musculoskeletal: She exhibits no edema.  Lymphadenopathy:    She has no cervical adenopathy.  Neurological: She is alert and oriented to person, place, and time.  Skin: Skin is warm and dry.  Psychiatric: She has a normal mood and affect. Her behavior is normal.  Vitals reviewed.         Assessment & Plan:

## 2014-07-14 NOTE — Patient Instructions (Signed)
Schedule your complete physical in 6 months We'll notify you of your lab results and make any changes if needed Keep up the good work!  You look great! Call with any questions or concerns Happy Spring!!! 

## 2014-07-14 NOTE — Assessment & Plan Note (Signed)
Chronic problem.  Attempting to control w/ healthy diet and regular exercise.  Check labs.  Start meds prn. 

## 2014-07-14 NOTE — Assessment & Plan Note (Signed)
Chronic problem.  Excellent control.  Asymptomatic.  Check labs.  No anticipated med changes. 

## 2014-07-15 ENCOUNTER — Other Ambulatory Visit: Payer: Self-pay | Admitting: Family Medicine

## 2014-07-17 NOTE — Telephone Encounter (Signed)
Med filled.  

## 2014-12-21 ENCOUNTER — Ambulatory Visit (INDEPENDENT_AMBULATORY_CARE_PROVIDER_SITE_OTHER): Payer: 59 | Admitting: Family Medicine

## 2014-12-21 ENCOUNTER — Encounter: Payer: Self-pay | Admitting: Family Medicine

## 2014-12-21 VITALS — BP 140/78 | HR 68 | Temp 99.3°F | Wt 183.4 lb

## 2014-12-21 DIAGNOSIS — J018 Other acute sinusitis: Secondary | ICD-10-CM

## 2014-12-21 DIAGNOSIS — J4521 Mild intermittent asthma with (acute) exacerbation: Secondary | ICD-10-CM | POA: Diagnosis not present

## 2014-12-21 DIAGNOSIS — R509 Fever, unspecified: Secondary | ICD-10-CM | POA: Diagnosis not present

## 2014-12-21 DIAGNOSIS — N760 Acute vaginitis: Secondary | ICD-10-CM

## 2014-12-21 LAB — POCT INFLUENZA A/B
Influenza A, POC: NEGATIVE
Influenza B, POC: NEGATIVE

## 2014-12-21 MED ORDER — BECLOMETHASONE DIPROPIONATE 40 MCG/ACT IN AERS: 2.0000 | INHALATION_SPRAY | Freq: Two times a day (BID) | RESPIRATORY_TRACT | Status: DC

## 2014-12-21 MED ORDER — FLUTICASONE PROPIONATE 50 MCG/ACT NA SUSP: 2.0000 | Freq: Every day | NASAL | Status: DC

## 2014-12-21 MED ORDER — AMOXICILLIN-POT CLAVULANATE 875-125 MG PO TABS: 1.0000 | ORAL_TABLET | Freq: Two times a day (BID) | ORAL | Status: DC

## 2014-12-21 MED ORDER — FLUCONAZOLE 150 MG PO TABS: 150.0000 mg | ORAL_TABLET | Freq: Once | ORAL | Status: DC

## 2014-12-21 NOTE — Progress Notes (Signed)
Pre visit review using our clinic review tool, if applicable. No additional management support is needed unless otherwise documented below in the visit note. 

## 2014-12-21 NOTE — Patient Instructions (Signed)

## 2014-12-21 NOTE — Progress Notes (Signed)
Patient ID: Amy Torres, female    DOB: 11-21-1975  Age: 39 y.o. MRN: [id number]    Subjective:  Subjective HPI Amy Torres presents for uri, fever ( she felt hot) x 1 week.  With body aches.     Review of Systems  Constitutional: Positive for chills. Negative for fever.  HENT: Positive for congestion, postnasal drip, rhinorrhea and sinus pressure.   Respiratory: Positive for cough. Negative for chest tightness, shortness of breath and wheezing.   Cardiovascular: Negative for chest pain, palpitations and leg swelling.  Allergic/Immunologic: Negative for environmental allergies.    History Past Medical History  Diagnosis Date  . Asthma   . Hypertension   . Miscarriage     12/11  . Diabetes mellitus     borderline gestational  . Pregnancy, complicated 5/72/6203  . Previous cesarean delivery, delivered 01/01/2011    She has past surgical history that includes Breast cyst excision and Cesarean section.   Her family history includes Cancer in her maternal aunt and paternal aunt; Hypertension in her mother; Stroke in her mother.She reports that she has never smoked. She does not have any smokeless tobacco history on file. She reports that she does not drink alcohol or use illicit drugs.  Current Outpatient Prescriptions on File Prior to Visit  Medication Sig Dispense Refill  . albuterol (PROVENTIL HFA;VENTOLIN HFA) 108 (90 BASE) MCG/ACT inhaler Inhale 2 puffs into the lungs every 4 (four) hours as needed for wheezing or shortness of breath. 3 Inhaler 3  . ibuprofen (ADVIL,MOTRIN) 200 MG tablet Take 400 mg by mouth every 6 (six) hours as needed for moderate pain.    Marland Kitchen labetalol (NORMODYNE) 200 MG tablet TAKE 1 TABLET (200 MG TOTAL) BY MOUTH 2 (TWO) TIMES DAILY. 180 tablet 1   No current facility-administered medications on file prior to visit.     Objective:  Objective Physical Exam  Constitutional: She is oriented to person, place, and time. She appears well-developed  and well-nourished.  HENT:  Right Ear: External ear normal.  Left Ear: External ear normal.  + PND + errythema  Eyes: Conjunctivae are normal. Right eye exhibits no discharge. Left eye exhibits no discharge.  Cardiovascular: Normal rate, regular rhythm and normal heart sounds.   No murmur heard. Pulmonary/Chest: Effort normal and breath sounds normal. No respiratory distress. She has no wheezes. She has no rales. She exhibits no tenderness.  Musculoskeletal: She exhibits no edema.  Lymphadenopathy:    She has cervical adenopathy.  Neurological: She is alert and oriented to person, place, and time.  Psychiatric: She has a normal mood and affect. Her behavior is normal. Judgment and thought content normal.   BP 140/78 mmHg  Pulse 68  Temp(Src) 99.3 F (37.4 C) (Oral)  Wt 183 lb 6.4 oz (83.19 kg)  SpO2 98% Wt Readings from Last 3 Encounters:  12/21/14 183 lb 6.4 oz (83.19 kg)  07/14/14 181 lb 2 oz (82.158 kg)  01/12/14 183 lb 8 oz (83.235 kg)     Lab Results  Component Value Date   WBC 7.5 07/14/2014   HGB 13.5 07/14/2014   HCT 39.8 07/14/2014   PLT 282.0 07/14/2014   GLUCOSE 99 07/14/2014   CHOL 188 07/14/2014   TRIG 210.0* 07/14/2014   HDL 39.20 07/14/2014   LDLDIRECT 120.0 07/14/2014   LDLCALC 95 12/08/2012   ALT 23 07/14/2014   AST 19 07/14/2014   NA 137 07/14/2014   K 3.7 07/14/2014   CL 103 07/14/2014  CREATININE 0.69 07/14/2014   BUN 11 07/14/2014   CO2 28 07/14/2014   TSH 1.47 01/12/2014   HGBA1C 6.1 01/12/2014   MICROALBUR 5.7* 12/31/2009    US Breast Ltd Uni Right Inc Axilla  02/23/2014   CLINICAL DATA:  Patient is an asymptomatic 39 year old female who presents for short-term follow-up right breast ultrasound.  EXAM: ULTRASOUND OF THE RIGHT BREAST  COMPARISON:  Right breast ultrasound 08/23/2013.  FINDINGS: On physical exam, no palpable masses are noted  Targeted ultrasound is performed. This again demonstrates a homogeneously hypoechoic oval  well-circumscribed mass with parallel orientation at 6 o'clock approximately 2 cm from the nipple measuring 7 x 10 x 4 mm. This is unchanged with sonographic features most consistent with a benign fibroadenoma. In addition, there is a oval hypoechoic mass at 1 o'clock 5 cm from the nipple measuring 5 mm in diameter. This is unchanged with sonographic features most consistent with a complicated cyst.  IMPRESSION: No significant interval change in the previous described probably benign right breast masses.  RECOMMENDATION: Recommend bilateral diagnostic mammography and right breast ultrasound in 6 months to maintain an annual interval and continue the short-term follow-up imaging of the right breast.  I have discussed the findings and recommendations with the patient. Results were also provided in writing at the conclusion of the visit. If applicable, a reminder letter will be sent to the patient regarding the next appointment.  BI-RADS CATEGORY  3: Probably benign finding(s) - short interval follow-up suggested.   Electronically Signed   By: Amy Torres   On: 02/23/2014 10:55     Assessment & Plan:  Plan I have discontinued Amy Torres's acetaminophen and montelukast. I am also having her start on beclomethasone, amoxicillin-clavulanate, fluticasone, and fluconazole. Additionally, I am having her maintain her ibuprofen, albuterol, and labetalol.  Meds ordered this encounter  Medications  . beclomethasone (QVAR) 40 MCG/ACT inhaler    Sig: Inhale 2 puffs into the lungs 2 (two) times daily.    Dispense:  1 Inhaler    Refill:  12  . amoxicillin-clavulanate (AUGMENTIN) 875-125 MG per tablet    Sig: Take 1 tablet by mouth 2 (two) times daily.    Dispense:  20 tablet    Refill:  0  . fluticasone (FLONASE) 50 MCG/ACT nasal spray    Sig: Place 2 sprays into both nostrils daily.    Dispense:  16 g    Refill:  6  . fluconazole (DIFLUCAN) 150 MG tablet    Sig: Take 1 tablet (150 mg total) by mouth once.     Dispense:  2 tablet    Refill:  0    Problem List Items Addressed This Visit    None    Visit Diagnoses    Fever, unspecified fever cause    -  Primary    Relevant Orders    POCT Influenza A/B (Completed)    Asthma with acute exacerbation, mild intermittent        Relevant Medications    beclomethasone (QVAR) 40 MCG/ACT inhaler    fluticasone (FLONASE) 50 MCG/ACT nasal spray    Other acute sinusitis        Relevant Medications    amoxicillin-clavulanate (AUGMENTIN) 875-125 MG per tablet    fluticasone (FLONASE) 50 MCG/ACT nasal spray    fluconazole (DIFLUCAN) 150 MG tablet    Vaginitis and vulvovaginitis        Relevant Medications    fluconazole (DIFLUCAN) 150 MG tablet  Follow-up: Return if symptoms worsen or fail to improve.  Garnet Koyanagi, DO

## 2015-01-17 ENCOUNTER — Telehealth: Payer: Self-pay

## 2015-01-17 NOTE — Telephone Encounter (Signed)
Pre visit call completed 

## 2015-01-18 ENCOUNTER — Encounter: Payer: 59 | Admitting: Family Medicine

## 2015-01-19 ENCOUNTER — Encounter: Payer: Self-pay | Admitting: Family Medicine

## 2015-01-19 ENCOUNTER — Ambulatory Visit (INDEPENDENT_AMBULATORY_CARE_PROVIDER_SITE_OTHER): Payer: 59 | Admitting: Family Medicine

## 2015-01-19 ENCOUNTER — Encounter (INDEPENDENT_AMBULATORY_CARE_PROVIDER_SITE_OTHER): Payer: Self-pay

## 2015-01-19 VITALS — BP 140/80 | HR 64 | Temp 98.1°F | Ht 62.0 in | Wt 181.8 lb

## 2015-01-19 DIAGNOSIS — Z1231 Encounter for screening mammogram for malignant neoplasm of breast: Secondary | ICD-10-CM | POA: Diagnosis not present

## 2015-01-19 DIAGNOSIS — Z0000 Encounter for general adult medical examination without abnormal findings: Secondary | ICD-10-CM

## 2015-01-19 LAB — BASIC METABOLIC PANEL
BUN: 13 mg/dL (ref 6–23)
CALCIUM: 9.1 mg/dL (ref 8.4–10.5)
CHLORIDE: 103 meq/L (ref 96–112)
CO2: 26 meq/L (ref 19–32)
Creatinine, Ser: 0.68 mg/dL (ref 0.40–1.20)
GFR: 101.41 mL/min (ref >60.00)
Glucose, Bld: 106 mg/dL — ABNORMAL HIGH (ref 70–99)
POTASSIUM: 3.7 meq/L (ref 3.5–5.1)
SODIUM: 139 meq/L (ref 135–145)

## 2015-01-19 LAB — CBC WITH DIFFERENTIAL/PLATELET
Basophils Absolute: 0 10*3/uL (ref 0.0–0.1)
Basophils Relative: 0.5 % (ref 0.0–3.0)
Eosinophils Absolute: 0.1 10*3/uL (ref 0.0–0.7)
Eosinophils Relative: 1.3 % (ref 0.0–5.0)
HCT: 40.5 % (ref 36.0–46.0)
Hemoglobin: 13.8 g/dL (ref 12.0–15.0)
LYMPHS ABS: 2.4 10*3/uL (ref 0.7–4.0)
Lymphocytes Relative: 30.5 % (ref 12.0–46.0)
MCHC: 34.1 g/dL (ref 30.0–36.0)
MCV: 86.6 fl (ref 78.0–100.0)
MONO ABS: 0.5 10*3/uL (ref 0.1–1.0)
MONOS PCT: 6.3 % (ref 3.0–12.0)
NEUTROS ABS: 4.8 10*3/uL (ref 1.4–7.7)
NEUTROS PCT: 61.4 % (ref 43.0–77.0)
PLATELETS: 260 10*3/uL (ref 150.0–400.0)
RBC: 4.68 Mil/uL (ref 3.87–5.11)
RDW: 12.9 % (ref 11.5–15.5)
WBC: 7.8 10*3/uL (ref 4.0–10.5)

## 2015-01-19 LAB — LIPID PANEL
CHOLESTEROL: 179 mg/dL (ref 0–200)
HDL: 40.5 mg/dL (ref >39.00)
LDL Cholesterol: 100 mg/dL — ABNORMAL HIGH (ref 0–99)
NONHDL: 138.53
Total CHOL/HDL Ratio: 4
Triglycerides: 192 mg/dL — ABNORMAL HIGH (ref 0.0–149.0)
VLDL: 38.4 mg/dL (ref 0.0–40.0)

## 2015-01-19 LAB — HEPATIC FUNCTION PANEL
ALK PHOS: 49 U/L (ref 39–117)
ALT: 27 U/L (ref 0–35)
AST: 20 U/L (ref 0–37)
Albumin: 4.4 g/dL (ref 3.5–5.2)
BILIRUBIN DIRECT: 0.1 mg/dL (ref 0.0–0.3)
TOTAL PROTEIN: 7.6 g/dL (ref 6.0–8.3)
Total Bilirubin: 0.7 mg/dL (ref 0.2–1.2)

## 2015-01-19 NOTE — Progress Notes (Signed)
   Subjective:    Patient ID: Amy Torres, female    DOB: [birth year], 39 y.o.   MRN: [id number]  HPI CPE- UTD w/ GYN, due for mammo [facility])   Review of Systems Patient reports no vision/ hearing changes, adenopathy,fever, weight change,  persistant/recurrent hoarseness , swallowing issues, chest pain, palpitations, edema, persistant/recurrent cough, hemoptysis, dyspnea (rest/exertional/paroxysmal nocturnal), gastrointestinal bleeding (melena, rectal bleeding), abdominal pain, significant heartburn, bowel changes, GU symptoms (dysuria, hematuria, incontinence), Gyn symptoms (abnormal  bleeding, pain),  syncope, focal weakness, memory loss, numbness & tingling, skin/hair/nail changes, abnormal bruising or bleeding, anxiety, or depression.     Objective:   Physical Exam General Appearance:    Alert, cooperative, no distress, appears stated age  Head:    Normocephalic, without obvious abnormality, atraumatic  Eyes:    PERRL, conjunctiva/corneas clear, EOM's intact, fundi    benign, both eyes  Ears:    Normal TM's and external ear canals, both ears  Nose:   Nares normal, septum midline, mucosa normal, no drainage    or sinus tenderness  Throat:   Lips, mucosa, and tongue normal; teeth and gums normal  Neck:   Supple, symmetrical, trachea midline, no adenopathy;    Thyroid: no enlargement/tenderness/nodules  Back:     Symmetric, no curvature, ROM normal, no CVA tenderness  Lungs:     Clear to auscultation bilaterally, respirations unlabored  Chest Wall:    No tenderness or deformity   Heart:    Regular rate and rhythm, S1 and S2 normal, no murmur, rub   or gallop  Breast Exam:    Deferred to GYN  Abdomen:     Soft, non-tender, bowel sounds active all four quadrants,    no masses, no organomegaly  Genitalia:    Deferred to GYN  Rectal:    Extremities:   Extremities normal, atraumatic, no cyanosis or edema  Pulses:   2+ and symmetric all extremities  Skin:   Skin color,  texture, turgor normal, no rashes or lesions  Lymph nodes:   Cervical, supraclavicular, and axillary nodes normal  Neurologic:   CNII-XII intact, normal strength, sensation and reflexes    throughout          Assessment & Plan:

## 2015-01-19 NOTE — Assessment & Plan Note (Signed)
Pt's PE WNL w/ exception of obesity.  UTD on pap.  Overdue for mammo- order entered.  UTD on flu shot.  Check labs.  Anticipatory guidance provided.

## 2015-01-19 NOTE — Patient Instructions (Signed)
Follow up in 6 months to recheck BP We'll notify you of your lab results and make any changes if needed Keep up the good work on healthy diet and regular exercise- you look great!!! We'll call you with your mammogram appt Call with any questions or concerns If you want to join [place] at the new [place] office, any scheduled appointments will automatically transfer and we will see you at [address] [place] [address] [place], [place] [phone]  Happy Fall!!!

## 2015-01-19 NOTE — Progress Notes (Signed)
Pre visit review using our clinic review tool, if applicable. No additional management support is needed unless otherwise documented below in the visit note. 

## 2015-01-26 ENCOUNTER — Other Ambulatory Visit: Payer: Self-pay | Admitting: General Practice

## 2015-01-26 LAB — VITAMIN D 25 HYDROXY (VIT D DEFICIENCY, FRACTURES): VITD: 19.21 ng/mL — AB (ref 30.00–100.00)

## 2015-01-26 MED ORDER — VITAMIN D (ERGOCALCIFEROL) 1.25 MG (50000 UNIT) PO CAPS: 50000.0000 [IU] | ORAL_CAPSULE | ORAL | Status: DC

## 2015-02-01 ENCOUNTER — Other Ambulatory Visit: Payer: Self-pay | Admitting: Family Medicine

## 2015-02-01 NOTE — Telephone Encounter (Signed)
Medication filled to pharmacy as requested.   

## 2015-03-29 ENCOUNTER — Ambulatory Visit: Payer: 59 | Admitting: Family Medicine

## 2015-03-30 ENCOUNTER — Ambulatory Visit (INDEPENDENT_AMBULATORY_CARE_PROVIDER_SITE_OTHER): Payer: 59 | Admitting: Family Medicine

## 2015-03-30 ENCOUNTER — Encounter: Payer: Self-pay | Admitting: Family Medicine

## 2015-03-30 VITALS — BP 160/94 | HR 61 | Temp 98.0°F | Resp 16 | Ht 62.0 in | Wt 186.2 lb

## 2015-03-30 DIAGNOSIS — I10 Essential (primary) hypertension: Secondary | ICD-10-CM | POA: Diagnosis not present

## 2015-03-30 MED ORDER — HYDROCHLOROTHIAZIDE 12.5 MG PO TABS: 12.5000 mg | ORAL_TABLET | Freq: Every day | ORAL | Status: DC

## 2015-03-30 NOTE — Patient Instructions (Signed)
Follow up in 1 month to recheck BP Continue the Labetalol twice daily Add the HCTZ daily Drink plenty of fluids Limit your salt intake Try and get regular exercise- this will improve your BP Call with any questions or concerns If you want to join [place] at the new [place] office, any scheduled appointments will automatically transfer and we will see you at [address] [place] [address] [place], [place] [address] (OPENING 04/17/15) Happy Holidays!!!

## 2015-03-30 NOTE — Progress Notes (Signed)
Pre visit review using our clinic review tool, if applicable. No additional management support is needed unless otherwise documented below in the visit note. 

## 2015-03-30 NOTE — Progress Notes (Signed)
   Subjective:    Patient ID: Amy Torres, female    DOB: 11-29-1975, 39 y.o.   MRN: [id number]  HPI HTN- chronic problem, currently on Labetalol twice daily.  Pt reports BP has been elevated x1 month.  She has been having it checked at school and readings have been as high as 192/110.  When BP is elevated pt will get HA and some lightheadedness.  No CP, SOB, visual changes, edema.  Increased stress recently.   Review of Systems For ROS see HPI     Objective:   Physical Exam  Constitutional: She is oriented to person, place, and time. She appears well-developed and well-nourished. No distress.  HENT:  Head: Normocephalic and atraumatic.  Eyes: Conjunctivae and EOM are normal. Pupils are equal, round, and reactive to light.  Neck: Normal range of motion. Neck supple. No thyromegaly present.  Cardiovascular: Normal rate, regular rhythm, normal heart sounds and intact distal pulses.   No murmur heard. Pulmonary/Chest: Effort normal and breath sounds normal. No respiratory distress.  Abdominal: Soft. She exhibits no distension. There is no tenderness.  Musculoskeletal: She exhibits no edema.  Lymphadenopathy:    She has no cervical adenopathy.  Neurological: She is alert and oriented to person, place, and time.  Skin: Skin is warm and dry.  Psychiatric: She has a normal mood and affect. Her behavior is normal.  Vitals reviewed.         Assessment & Plan:

## 2015-03-30 NOTE — Assessment & Plan Note (Signed)
Deteriorated.  BP is now much higher than previous.  + HAs.  Increased stress recently.  Start HCTZ daily in addition to Labetalol BID.  Reviewed need for healthy diet choices and regular exercise as well as stress management.  Will continue to follow.

## 2015-04-22 ENCOUNTER — Other Ambulatory Visit: Payer: Self-pay | Admitting: Family Medicine

## 2015-04-24 NOTE — Telephone Encounter (Signed)
Rx denied-Have finished medication.//AB/CMA

## 2015-04-30 ENCOUNTER — Encounter: Payer: Self-pay | Admitting: Family Medicine

## 2015-04-30 ENCOUNTER — Ambulatory Visit (INDEPENDENT_AMBULATORY_CARE_PROVIDER_SITE_OTHER): Payer: 59 | Admitting: Family Medicine

## 2015-04-30 VITALS — BP 115/64 | HR 82 | Temp 98.1°F | Ht 62.0 in | Wt 183.4 lb

## 2015-04-30 DIAGNOSIS — I10 Essential (primary) hypertension: Secondary | ICD-10-CM

## 2015-04-30 LAB — BASIC METABOLIC PANEL
BUN: 14 mg/dL (ref 6–23)
CALCIUM: 9.1 mg/dL (ref 8.4–10.5)
CO2: 23 meq/L (ref 19–32)
CREATININE: 0.7 mg/dL (ref 0.40–1.20)
Chloride: 100 mEq/L (ref 96–112)
GFR: 97.94 mL/min (ref >60.00)
Glucose, Bld: 131 mg/dL — ABNORMAL HIGH (ref 70–99)
Potassium: 3.5 mEq/L (ref 3.5–5.1)
SODIUM: 137 meq/L (ref 135–145)

## 2015-04-30 NOTE — Progress Notes (Signed)
Pre visit review using our clinic review tool, if applicable. No additional management support is needed unless otherwise documented below in the visit note. 

## 2015-04-30 NOTE — Progress Notes (Signed)
   Subjective:    Patient ID: Amy Torres, female    DOB: 06/27/1975, 40 y.o.   MRN: [id number]  HPI HTN- BP is MUCH better today w/ addition of HCTZ daily.  Also on Labetalol twice daily.  Pt continues to work on weight loss.  Denies CP, SOB, HAs, visual changes, edema.   Review of Systems For ROS see HPI     Objective:   Physical Exam  Constitutional: She is oriented to person, place, and time. She appears well-developed and well-nourished. No distress.  HENT:  Head: Normocephalic and atraumatic.  Eyes: Conjunctivae and EOM are normal. Pupils are equal, round, and reactive to light.  Neck: Normal range of motion. Neck supple. No thyromegaly present.  Cardiovascular: Normal rate, regular rhythm, normal heart sounds and intact distal pulses.   No murmur heard. Pulmonary/Chest: Effort normal and breath sounds normal. No respiratory distress.  Abdominal: Soft. She exhibits no distension. There is no tenderness.  Musculoskeletal: She exhibits no edema.  Lymphadenopathy:    She has no cervical adenopathy.  Neurological: She is alert and oriented to person, place, and time.  Skin: Skin is warm and dry.  Psychiatric: She has a normal mood and affect. Her behavior is normal.  Vitals reviewed.         Assessment & Plan:

## 2015-04-30 NOTE — Assessment & Plan Note (Signed)
Much improved since adding HCTZ.  Asymptomatic at this time.  Check BMP.  No anticipated med changes.  Will continue to follow.

## 2015-04-30 NOTE — Patient Instructions (Signed)
Schedule your complete physical for October We'll notify you of your lab results and make any changes if needed Keep up the good work!  You look great!! No med changes at this time Call with any questions or concerns Happy New Year!!!

## 2015-05-23 ENCOUNTER — Other Ambulatory Visit: Payer: Self-pay | Admitting: Family Medicine

## 2015-05-23 ENCOUNTER — Ambulatory Visit (HOSPITAL_BASED_OUTPATIENT_CLINIC_OR_DEPARTMENT_OTHER)
Admission: RE | Admit: 2015-05-23 | Discharge: 2015-05-23 | Disposition: A | Discharge status: Home / Self Care | Payer: 59 | Source: Ambulatory Visit | Attending: Family Medicine | Admitting: Family Medicine

## 2015-05-23 ENCOUNTER — Ambulatory Visit (INDEPENDENT_AMBULATORY_CARE_PROVIDER_SITE_OTHER): Payer: 59 | Admitting: Family Medicine

## 2015-05-23 ENCOUNTER — Encounter: Payer: Self-pay | Admitting: Family Medicine

## 2015-05-23 VITALS — BP 140/88 | HR 76 | Temp 97.9°F | Ht 62.0 in | Wt 184.4 lb

## 2015-05-23 DIAGNOSIS — R935 Abnormal findings on diagnostic imaging of other abdominal regions, including retroperitoneum: Secondary | ICD-10-CM | POA: Diagnosis not present

## 2015-05-23 DIAGNOSIS — R102 Pelvic and perineal pain: Secondary | ICD-10-CM | POA: Diagnosis not present

## 2015-05-23 DIAGNOSIS — R1031 Right lower quadrant pain: Secondary | ICD-10-CM

## 2015-05-23 DIAGNOSIS — R319 Hematuria, unspecified: Secondary | ICD-10-CM | POA: Diagnosis not present

## 2015-05-23 LAB — POCT URINALYSIS DIPSTICK
Bilirubin, UA: NEGATIVE
Glucose, UA: NEGATIVE
KETONES UA: NEGATIVE
LEUKOCYTES UA: NEGATIVE
NITRITE UA: NEGATIVE
PH UA: 6
PROTEIN UA: 0.15
Spec Grav, UA: 1.025

## 2015-05-23 MED ORDER — CEPHALEXIN 500 MG PO CAPS: 500.0000 mg | ORAL_CAPSULE | Freq: Two times a day (BID) | ORAL | Status: AC

## 2015-05-23 NOTE — Assessment & Plan Note (Signed)
New to provider.  Pt's sxs were severe last night but much improved currently.  Still w/ TTP over bladder and RLQ and voluntary guarding.  No rebound.  No fever.  Pt denies urinary sxs but + microscopic blood.  Start abx for possible UTI.  Given pain, get [place] to assess for possible ovarian cause.  Appendicitis on differential but less likely due to intermittent, episodic nature of pain.  Get pelvic US for complete evaluation.  Check labs to r/o infxn.  Reviewed supportive care and red flags that should prompt return.  Pt expressed understanding and is in agreement w/ plan.

## 2015-05-23 NOTE — Patient Instructions (Signed)
You have an ultrasound downstairs at 4:30 We'll notify you of your lab results and ultrasound results Drink LOTS of fluids prior to the ultrasound Call with any questions or concerns Hang in there!!

## 2015-05-23 NOTE — Progress Notes (Signed)
   Subjective:    Patient ID: Amy Torres, female    DOB: Jul 13, 1975, 40 y.o.   MRN: [id number]  HPI abd pain- RLQ pain started yesterday evening, worsened as the night went on.  Pain was episodic.  + nausea, no vomiting.  Pain radiated through to back.  No pain w/ urination, no bleeding noted.  LMP 1/27.  Pain improved w/ ibuprofen.  Pt has hx of similar- had GYN eval and no cause was found.  No one at home w/ similar.  No diarrhea.   Review of Systems For ROS see HPI     Objective:   Physical Exam  Constitutional: She is oriented to person, place, and time. She appears well-developed and well-nourished. No distress.  HENT:  Head: Normocephalic and atraumatic.  Cardiovascular: Normal rate, regular rhythm and normal heart sounds.   Pulmonary/Chest: Effort normal and breath sounds normal. No respiratory distress. She has no wheezes. She has no rales.  Abdominal: Soft. Bowel sounds are normal. She exhibits no distension. There is tenderness (TTP over bladder and RLQ- no CVA tenderness). There is guarding (voluntary guarding over bladder and RLQ). There is no rebound.  Neurological: She is alert and oriented to person, place, and time.  Skin: Skin is warm and dry.  Psychiatric: She has a normal mood and affect. Her behavior is normal. Thought content normal.  Vitals reviewed.         Assessment & Plan:

## 2015-05-23 NOTE — Progress Notes (Signed)
Pre visit review using our clinic review tool, if applicable. No additional management support is needed unless otherwise documented below in the visit note. 

## 2015-05-24 LAB — BASIC METABOLIC PANEL
BUN: 17 mg/dL (ref 6–23)
CALCIUM: 9.5 mg/dL (ref 8.4–10.5)
CO2: 30 meq/L (ref 19–32)
CREATININE: 0.9 mg/dL (ref 0.40–1.20)
Chloride: 98 mEq/L (ref 96–112)
GFR: 73.26 mL/min (ref >60.00)
Glucose, Bld: 139 mg/dL — ABNORMAL HIGH (ref 70–99)
Potassium: 3.2 mEq/L — ABNORMAL LOW (ref 3.5–5.1)
Sodium: 137 mEq/L (ref 135–145)

## 2015-05-24 LAB — CBC WITH DIFFERENTIAL/PLATELET
BASOS ABS: 0 10*3/uL (ref 0.0–0.1)
Basophils Relative: 0.5 % (ref 0.0–3.0)
EOS ABS: 0.1 10*3/uL (ref 0.0–0.7)
Eosinophils Relative: 1.5 % (ref 0.0–5.0)
HCT: 40.2 % (ref 36.0–46.0)
Hemoglobin: 13.6 g/dL (ref 12.0–15.0)
LYMPHS ABS: 2.8 10*3/uL (ref 0.7–4.0)
LYMPHS PCT: 30.2 % (ref 12.0–46.0)
MCHC: 33.7 g/dL (ref 30.0–36.0)
MCV: 87.8 fl (ref 78.0–100.0)
Monocytes Absolute: 0.8 10*3/uL (ref 0.1–1.0)
Monocytes Relative: 8.6 % (ref 3.0–12.0)
NEUTROS ABS: 5.5 10*3/uL (ref 1.4–7.7)
NEUTROS PCT: 59.2 % (ref 43.0–77.0)
PLATELETS: 309 10*3/uL (ref 150.0–400.0)
RBC: 4.58 Mil/uL (ref 3.87–5.11)
RDW: 12.6 % (ref 11.5–15.5)
WBC: 9.3 10*3/uL (ref 4.0–10.5)

## 2015-05-25 LAB — URINE CULTURE

## 2015-06-01 ENCOUNTER — Other Ambulatory Visit: Payer: Self-pay | Admitting: Family Medicine

## 2015-06-01 ENCOUNTER — Encounter: Payer: Self-pay | Admitting: Family Medicine

## 2015-06-04 MED ORDER — FLUCONAZOLE 150 MG PO TABS: 150.0000 mg | ORAL_TABLET | Freq: Once | ORAL | Status: DC

## 2015-06-04 NOTE — Telephone Encounter (Signed)
Medication filled to pharmacy as requested.   

## 2015-06-19 ENCOUNTER — Other Ambulatory Visit: Payer: Self-pay | Admitting: Family Medicine

## 2015-06-19 NOTE — Telephone Encounter (Signed)
Medication filled to pharmacy as requested.   

## 2015-06-28 ENCOUNTER — Other Ambulatory Visit: Payer: Self-pay | Admitting: Family Medicine

## 2015-06-28 NOTE — Telephone Encounter (Signed)
Medication filled to pharmacy as requested.   

## 2015-07-20 ENCOUNTER — Ambulatory Visit: Payer: 59 | Admitting: Family Medicine

## 2015-07-25 ENCOUNTER — Encounter: Payer: Self-pay | Admitting: Family Medicine

## 2015-07-30 ENCOUNTER — Other Ambulatory Visit: Payer: Self-pay | Admitting: General Practice

## 2015-07-30 MED ORDER — LABETALOL HCL 200 MG PO TABS: 200.0000 mg | ORAL_TABLET | Freq: Two times a day (BID) | ORAL | Status: DC

## 2015-08-01 ENCOUNTER — Other Ambulatory Visit: Payer: Self-pay | Admitting: General Practice

## 2015-08-01 DIAGNOSIS — J4521 Mild intermittent asthma with (acute) exacerbation: Secondary | ICD-10-CM

## 2015-08-01 MED ORDER — HYDROCHLOROTHIAZIDE 12.5 MG PO CAPS: 12.5000 mg | ORAL_CAPSULE | Freq: Every day | ORAL | Status: DC

## 2016-01-14 ENCOUNTER — Other Ambulatory Visit: Payer: Self-pay | Admitting: General Practice

## 2016-01-14 MED ORDER — HYDROCHLOROTHIAZIDE 12.5 MG PO CAPS: 12.5000 mg | ORAL_CAPSULE | Freq: Every day | ORAL | 0 refills | Status: DC

## 2016-01-23 ENCOUNTER — Encounter: Payer: 59 | Admitting: Family Medicine

## 2016-01-29 ENCOUNTER — Other Ambulatory Visit: Payer: Self-pay | Admitting: Family Medicine

## 2016-05-08 ENCOUNTER — Other Ambulatory Visit: Payer: Self-pay | Admitting: Family Medicine

## 2016-05-28 ENCOUNTER — Ambulatory Visit (INDEPENDENT_AMBULATORY_CARE_PROVIDER_SITE_OTHER): Payer: BLUE CROSS/BLUE SHIELD | Admitting: Family Medicine

## 2016-05-28 ENCOUNTER — Encounter: Payer: Self-pay | Admitting: Family Medicine

## 2016-05-28 VITALS — BP 123/82 | HR 76 | Temp 97.9°F | Resp 16 | Ht 62.0 in | Wt 172.5 lb

## 2016-05-28 DIAGNOSIS — Z0000 Encounter for general adult medical examination without abnormal findings: Secondary | ICD-10-CM | POA: Diagnosis not present

## 2016-05-28 DIAGNOSIS — G4730 Sleep apnea, unspecified: Secondary | ICD-10-CM

## 2016-05-28 DIAGNOSIS — E669 Obesity, unspecified: Secondary | ICD-10-CM | POA: Diagnosis not present

## 2016-05-28 DIAGNOSIS — Z6831 Body mass index (BMI) 31.0-31.9, adult: Secondary | ICD-10-CM | POA: Diagnosis not present

## 2016-05-28 LAB — CBC WITH DIFFERENTIAL/PLATELET
Basophils Absolute: 0 10*3/uL (ref 0.0–0.1)
Basophils Relative: 0.5 % (ref 0.0–3.0)
EOS PCT: 1.6 % (ref 0.0–5.0)
Eosinophils Absolute: 0.1 10*3/uL (ref 0.0–0.7)
HEMATOCRIT: 38.6 % (ref 36.0–46.0)
HEMOGLOBIN: 13 g/dL (ref 12.0–15.0)
Lymphocytes Relative: 42.8 % (ref 12.0–46.0)
Lymphs Abs: 2.9 10*3/uL (ref 0.7–4.0)
MCHC: 33.8 g/dL (ref 30.0–36.0)
MCV: 87.7 fl (ref 78.0–100.0)
MONOS PCT: 6.7 % (ref 3.0–12.0)
Monocytes Absolute: 0.5 10*3/uL (ref 0.1–1.0)
Neutro Abs: 3.3 10*3/uL (ref 1.4–7.7)
Neutrophils Relative %: 48.4 % (ref 43.0–77.0)
Platelets: 322 10*3/uL (ref 150.0–400.0)
RBC: 4.4 Mil/uL (ref 3.87–5.11)
RDW: 12.5 % (ref 11.5–15.5)
WBC: 6.8 10*3/uL (ref 4.0–10.5)

## 2016-05-28 LAB — LIPID PANEL
CHOL/HDL RATIO: 5
Cholesterol: 199 mg/dL (ref 0–200)
HDL: 38.6 mg/dL — AB (ref >39.00)
LDL Cholesterol: 126 mg/dL — ABNORMAL HIGH (ref 0–99)
NONHDL: 160.48
Triglycerides: 171 mg/dL — ABNORMAL HIGH (ref 0.0–149.0)
VLDL: 34.2 mg/dL (ref 0.0–40.0)

## 2016-05-28 LAB — HEPATIC FUNCTION PANEL
ALT: 27 U/L (ref 0–35)
AST: 19 U/L (ref 0–37)
Albumin: 4.5 g/dL (ref 3.5–5.2)
Alkaline Phosphatase: 42 U/L (ref 39–117)
BILIRUBIN DIRECT: 0.1 mg/dL (ref 0.0–0.3)
BILIRUBIN TOTAL: 0.8 mg/dL (ref 0.2–1.2)
Total Protein: 7.2 g/dL (ref 6.0–8.3)

## 2016-05-28 LAB — BASIC METABOLIC PANEL
BUN: 12 mg/dL (ref 6–23)
CALCIUM: 9.3 mg/dL (ref 8.4–10.5)
CHLORIDE: 100 meq/L (ref 96–112)
CO2: 32 mEq/L (ref 19–32)
CREATININE: 0.63 mg/dL (ref 0.40–1.20)
GFR: 110.03 mL/min (ref >60.00)
Glucose, Bld: 113 mg/dL — ABNORMAL HIGH (ref 70–99)
Potassium: 3.5 mEq/L (ref 3.5–5.1)
SODIUM: 138 meq/L (ref 135–145)

## 2016-05-28 LAB — TSH: TSH: 1.29 u[IU]/mL (ref 0.35–4.50)

## 2016-05-28 LAB — VITAMIN D 25 HYDROXY (VIT D DEFICIENCY, FRACTURES): VITD: 16.53 ng/mL — ABNORMAL LOW (ref 30.00–100.00)

## 2016-05-28 NOTE — Progress Notes (Signed)
Pre visit review using our clinic review tool, if applicable. No additional management support is needed unless otherwise documented below in the visit note. 

## 2016-05-28 NOTE — Patient Instructions (Signed)
Follow up in 6 months to recheck BP and weight loss progress We'll notify you of your lab results and make any changes if needed Keep up the good work on healthy diet and regular exercise- you're doing great!!! Call your GYN to schedule your pap and mammo Call with any questions or concerns Happy Valentine's Day!!!

## 2016-05-28 NOTE — Assessment & Plan Note (Signed)
Pt's PE WNL w/ exception of obesity.  Due for GYN appt- pt encouraged to schedule.  Applauded her efforts at weight loss.  Check labs.  Anticipatory guidance provided.

## 2016-05-28 NOTE — Progress Notes (Signed)
   Subjective:    Patient ID: Amy Torres, female    DOB: 18-Jan-1976, 41 y.o.   MRN: [id number]  HPI CPE- UTD on pap, due for mammo (needs to call GYN).  Husband has noted loud snoring and concerns for OSA.  Pt notes that sxs are improving as she loses weight (12 lbs since last visit)   Review of Systems Patient reports no vision/ hearing changes, adenopathy,fever, weight change,  persistant/recurrent hoarseness , swallowing issues, chest pain, palpitations, edema, persistant/recurrent cough, hemoptysis, dyspnea (rest/exertional/paroxysmal nocturnal), gastrointestinal bleeding (melena, rectal bleeding), abdominal pain, significant heartburn, bowel changes, GU symptoms (dysuria, hematuria, incontinence), Gyn symptoms (abnormal  bleeding, pain),  syncope, focal weakness, memory loss, numbness & tingling, skin/hair/nail changes, abnormal bruising or bleeding, anxiety, or depression.     Objective:   Physical Exam General Appearance:    Alert, cooperative, no distress, appears stated age, overweight  Head:    Normocephalic, without obvious abnormality, atraumatic  Eyes:    PERRL, conjunctiva/corneas clear, EOM's intact, fundi    benign, both eyes  Ears:    Normal TM's and external ear canals, both ears  Nose:   Nares normal, septum midline, mucosa normal, no drainage    or sinus tenderness  Throat:   Lips, mucosa, and tongue normal; teeth and gums normal  Neck:   Supple, symmetrical, trachea midline, no adenopathy;    Thyroid: no enlargement/tenderness/nodules  Back:     Symmetric, no curvature, ROM normal, no CVA tenderness  Lungs:     Clear to auscultation bilaterally, respirations unlabored  Chest Wall:    No tenderness or deformity   Heart:    Regular rate and rhythm, S1 and S2 normal, no murmur, rub   or gallop  Breast Exam:    Deferred to GYN  Abdomen:     Soft, non-tender, bowel sounds active all four quadrants,    no masses, no organomegaly  Genitalia:    Deferred to GYN    Rectal:    Extremities:   Extremities normal, atraumatic, no cyanosis or edema  Pulses:   2+ and symmetric all extremities  Skin:   Skin color, texture, turgor normal, no rashes or lesions  Lymph nodes:   Cervical, supraclavicular, and axillary nodes normal  Neurologic:   CNII-XII intact, normal strength, sensation and reflexes    throughout          Assessment & Plan:

## 2016-05-29 ENCOUNTER — Other Ambulatory Visit: Payer: Self-pay | Admitting: General Practice

## 2016-05-29 MED ORDER — VITAMIN D (ERGOCALCIFEROL) 1.25 MG (50000 UNIT) PO CAPS: 50000.0000 [IU] | ORAL_CAPSULE | ORAL | 0 refills | Status: DC

## 2016-06-03 ENCOUNTER — Encounter: Payer: Self-pay | Admitting: Family Medicine

## 2016-06-03 MED ORDER — FLUCONAZOLE 150 MG PO TABS: 150.0000 mg | ORAL_TABLET | Freq: Once | ORAL | 0 refills | Status: AC

## 2016-07-30 ENCOUNTER — Other Ambulatory Visit: Payer: Self-pay | Admitting: Family Medicine

## 2016-08-08 ENCOUNTER — Other Ambulatory Visit: Payer: Self-pay | Admitting: Family Medicine

## 2016-08-18 ENCOUNTER — Other Ambulatory Visit: Payer: Self-pay | Admitting: Family Medicine

## 2016-08-28 LAB — HM MAMMOGRAPHY: HM Mammogram: NORMAL (ref 0–4)

## 2016-08-28 LAB — HM PAP SMEAR

## 2016-10-16 ENCOUNTER — Encounter: Payer: Self-pay | Admitting: Family Medicine

## 2016-11-04 ENCOUNTER — Other Ambulatory Visit: Payer: Self-pay | Admitting: Family Medicine

## 2016-11-24 ENCOUNTER — Ambulatory Visit: Payer: BLUE CROSS/BLUE SHIELD | Admitting: Family Medicine

## 2016-11-28 ENCOUNTER — Encounter: Payer: Self-pay | Admitting: Family Medicine

## 2016-11-28 ENCOUNTER — Ambulatory Visit (INDEPENDENT_AMBULATORY_CARE_PROVIDER_SITE_OTHER): Payer: BLUE CROSS/BLUE SHIELD | Admitting: Family Medicine

## 2016-11-28 VITALS — BP 121/78 | HR 56 | Temp 98.2°F | Resp 16 | Ht 62.0 in | Wt 161.5 lb

## 2016-11-28 DIAGNOSIS — I10 Essential (primary) hypertension: Secondary | ICD-10-CM | POA: Diagnosis not present

## 2016-11-28 DIAGNOSIS — E663 Overweight: Secondary | ICD-10-CM | POA: Diagnosis not present

## 2016-11-28 LAB — BASIC METABOLIC PANEL
BUN: 14 mg/dL (ref 6–23)
CO2: 30 meq/L (ref 19–32)
Calcium: 9.1 mg/dL (ref 8.4–10.5)
Chloride: 100 mEq/L (ref 96–112)
Creatinine, Ser: 0.67 mg/dL (ref 0.40–1.20)
GFR: 102.24 mL/min (ref >60.00)
GLUCOSE: 113 mg/dL — AB (ref 70–99)
POTASSIUM: 3.3 meq/L — AB (ref 3.5–5.1)
Sodium: 139 mEq/L (ref 135–145)

## 2016-11-28 LAB — LIPID PANEL
CHOL/HDL RATIO: 4
Cholesterol: 177 mg/dL (ref 0–200)
HDL: 44.2 mg/dL (ref >39.00)
LDL CALC: 102 mg/dL — AB (ref 0–99)
NonHDL: 132.6
Triglycerides: 151 mg/dL — ABNORMAL HIGH (ref 0.0–149.0)
VLDL: 30.2 mg/dL (ref 0.0–40.0)

## 2016-11-28 LAB — CBC WITH DIFFERENTIAL/PLATELET
Basophils Absolute: 0 10*3/uL (ref 0.0–0.1)
Basophils Relative: 0.3 % (ref 0.0–3.0)
Eosinophils Absolute: 0.1 10*3/uL (ref 0.0–0.7)
Eosinophils Relative: 1.3 % (ref 0.0–5.0)
HEMATOCRIT: 39 % (ref 36.0–46.0)
Hemoglobin: 13.4 g/dL (ref 12.0–15.0)
Lymphocytes Relative: 43 % (ref 12.0–46.0)
Lymphs Abs: 3 10*3/uL (ref 0.7–4.0)
MCHC: 34.2 g/dL (ref 30.0–36.0)
MCV: 88.9 fl (ref 78.0–100.0)
Monocytes Absolute: 0.5 10*3/uL (ref 0.1–1.0)
Monocytes Relative: 7.7 % (ref 3.0–12.0)
NEUTROS ABS: 3.3 10*3/uL (ref 1.4–7.7)
Neutrophils Relative %: 47.7 % (ref 43.0–77.0)
Platelets: 277 10*3/uL (ref 150.0–400.0)
RBC: 4.39 Mil/uL (ref 3.87–5.11)
RDW: 12.9 % (ref 11.5–15.5)
WBC: 6.9 10*3/uL (ref 4.0–10.5)

## 2016-11-28 LAB — HEPATIC FUNCTION PANEL
ALBUMIN: 4.3 g/dL (ref 3.5–5.2)
ALK PHOS: 33 U/L — AB (ref 39–117)
ALT: 14 U/L (ref 0–35)
AST: 13 U/L (ref 0–37)
BILIRUBIN DIRECT: 0.1 mg/dL (ref 0.0–0.3)
TOTAL PROTEIN: 6.8 g/dL (ref 6.0–8.3)
Total Bilirubin: 0.9 mg/dL (ref 0.2–1.2)

## 2016-11-28 NOTE — Assessment & Plan Note (Signed)
Pt has lost 12 lbs since last visit!  Applauded her efforts and encouraged her to continue.  Will continue to follow.

## 2016-11-28 NOTE — Patient Instructions (Signed)
Schedule your complete physical in 6 months We'll notify you of your lab results and make any changes if needed Keep up the good work on healthy diet and regular exercise- you look great! Call with any questions or concerns Good luck w/ back to school!!!

## 2016-11-28 NOTE — Progress Notes (Signed)
   Subjective:    Patient ID: Amy Torres, female    DOB: 07-Sep-1975, 41 y.o.   MRN: [id number]  HPI HTN- chronic problem, well controlled on Labetalol and HCTZ daily.  No CP, SOB, HAs, visual changes, edema.  Obesity- pt has lost 12 lbs since last visit and BMI is now <30!  Pt is doing intermittent fasting, cutting carbs.  No formal exercise but very busy.  Pt reports increased energy.   Review of Systems For ROS see HPI     Objective:   Physical Exam  Constitutional: She is oriented to person, place, and time. She appears well-developed and well-nourished. No distress.  HENT:  Head: Normocephalic and atraumatic.  Eyes: Pupils are equal, round, and reactive to light. Conjunctivae and EOM are normal.  Neck: Normal range of motion. Neck supple. No thyromegaly present.  Cardiovascular: Normal rate, regular rhythm, normal heart sounds and intact distal pulses.   No murmur heard. Pulmonary/Chest: Effort normal and breath sounds normal. No respiratory distress.  Abdominal: Soft. She exhibits no distension. There is no tenderness.  Musculoskeletal: She exhibits no edema.  Lymphadenopathy:    She has no cervical adenopathy.  Neurological: She is alert and oriented to person, place, and time.  Skin: Skin is warm and dry.  Psychiatric: She has a normal mood and affect. Her behavior is normal.  Vitals reviewed.         Assessment & Plan:

## 2016-11-28 NOTE — Assessment & Plan Note (Signed)
Chronic problem.  Well controlled today.  Asymptomatic.  Check labs.  No anticipated med changes. 

## 2016-11-28 NOTE — Progress Notes (Signed)
Pre visit review using our clinic review tool, if applicable. No additional management support is needed unless otherwise documented below in the visit note. 

## 2016-12-01 ENCOUNTER — Other Ambulatory Visit (INDEPENDENT_AMBULATORY_CARE_PROVIDER_SITE_OTHER): Payer: BLUE CROSS/BLUE SHIELD

## 2016-12-01 DIAGNOSIS — R7309 Other abnormal glucose: Secondary | ICD-10-CM

## 2016-12-01 LAB — HEMOGLOBIN A1C: HEMOGLOBIN A1C: 6.1 % (ref 4.6–6.5)

## 2017-02-09 ENCOUNTER — Other Ambulatory Visit: Payer: Self-pay | Admitting: Family Medicine

## 2017-05-11 ENCOUNTER — Other Ambulatory Visit: Payer: Self-pay | Admitting: Family Medicine

## 2017-06-01 ENCOUNTER — Encounter: Payer: Self-pay | Admitting: General Practice

## 2017-06-05 ENCOUNTER — Other Ambulatory Visit: Payer: Self-pay

## 2017-06-05 ENCOUNTER — Encounter: Payer: Self-pay | Admitting: Family Medicine

## 2017-06-05 ENCOUNTER — Ambulatory Visit (INDEPENDENT_AMBULATORY_CARE_PROVIDER_SITE_OTHER): Payer: BLUE CROSS/BLUE SHIELD | Admitting: Family Medicine

## 2017-06-05 VITALS — BP 124/80 | HR 66 | Temp 98.9°F | Resp 16 | Ht 62.0 in | Wt 161.0 lb

## 2017-06-05 DIAGNOSIS — E559 Vitamin D deficiency, unspecified: Secondary | ICD-10-CM | POA: Diagnosis not present

## 2017-06-05 DIAGNOSIS — I10 Essential (primary) hypertension: Secondary | ICD-10-CM | POA: Diagnosis not present

## 2017-06-05 DIAGNOSIS — Z0000 Encounter for general adult medical examination without abnormal findings: Secondary | ICD-10-CM

## 2017-06-05 LAB — BASIC METABOLIC PANEL
BUN: 13 mg/dL (ref 6–23)
CHLORIDE: 101 meq/L (ref 96–112)
CO2: 30 meq/L (ref 19–32)
CREATININE: 0.59 mg/dL (ref 0.40–1.20)
Calcium: 9.4 mg/dL (ref 8.4–10.5)
GFR: 118.1 mL/min (ref >60.00)
Glucose, Bld: 105 mg/dL — ABNORMAL HIGH (ref 70–99)
POTASSIUM: 3.4 meq/L — AB (ref 3.5–5.1)
Sodium: 140 mEq/L (ref 135–145)

## 2017-06-05 LAB — CBC WITH DIFFERENTIAL/PLATELET
Basophils Absolute: 0 10*3/uL (ref 0.0–0.1)
Basophils Relative: 0.4 % (ref 0.0–3.0)
EOS ABS: 0.1 10*3/uL (ref 0.0–0.7)
Eosinophils Relative: 1.2 % (ref 0.0–5.0)
HCT: 38.9 % (ref 36.0–46.0)
HEMOGLOBIN: 13.2 g/dL (ref 12.0–15.0)
LYMPHS ABS: 2.5 10*3/uL (ref 0.7–4.0)
Lymphocytes Relative: 33.6 % (ref 12.0–46.0)
MCHC: 34 g/dL (ref 30.0–36.0)
MCV: 87.6 fl (ref 78.0–100.0)
MONO ABS: 0.7 10*3/uL (ref 0.1–1.0)
Monocytes Relative: 9.3 % (ref 3.0–12.0)
NEUTROS PCT: 55.5 % (ref 43.0–77.0)
Neutro Abs: 4.1 10*3/uL (ref 1.4–7.7)
PLATELETS: 295 10*3/uL (ref 150.0–400.0)
RBC: 4.44 Mil/uL (ref 3.87–5.11)
RDW: 12.9 % (ref 11.5–15.5)
WBC: 7.4 10*3/uL (ref 4.0–10.5)

## 2017-06-05 LAB — HEPATIC FUNCTION PANEL
ALK PHOS: 37 U/L — AB (ref 39–117)
ALT: 12 U/L (ref 0–35)
AST: 11 U/L (ref 0–37)
Albumin: 4.2 g/dL (ref 3.5–5.2)
Bilirubin, Direct: 0.1 mg/dL (ref 0.0–0.3)
Total Bilirubin: 0.9 mg/dL (ref 0.2–1.2)
Total Protein: 7 g/dL (ref 6.0–8.3)

## 2017-06-05 LAB — LIPID PANEL
Cholesterol: 171 mg/dL (ref 0–200)
HDL: 37.8 mg/dL — AB (ref >39.00)
LDL Cholesterol: 97 mg/dL (ref 0–99)
NonHDL: 133.38
TRIGLYCERIDES: 184 mg/dL — AB (ref 0.0–149.0)
Total CHOL/HDL Ratio: 5
VLDL: 36.8 mg/dL (ref 0.0–40.0)

## 2017-06-05 LAB — TSH: TSH: 1.32 u[IU]/mL (ref 0.35–4.50)

## 2017-06-05 LAB — VITAMIN D 25 HYDROXY (VIT D DEFICIENCY, FRACTURES): VITD: 29.39 ng/mL — AB (ref 30.00–100.00)

## 2017-06-05 NOTE — Assessment & Plan Note (Signed)
Chronic problem.  Well controlled today.  Asymptomatic.  Check labs.  No anticipated med changes.  Will follow. 

## 2017-06-05 NOTE — Assessment & Plan Note (Signed)
Pt has hx of this.  Check labs and replete prn. 

## 2017-06-05 NOTE — Progress Notes (Signed)
   Subjective:    Patient ID: Amy Torres, female    DOB: [birth year], 42 y.o.   MRN: [id number]  HPI CPE- UTD on GYN, flu, and Tdap.  No concerns today.   Review of Systems Patient reports no vision/ hearing changes, adenopathy,fever, weight change,  persistant/recurrent hoarseness , swallowing issues, chest pain, palpitations, edema, persistant/recurrent cough, hemoptysis, dyspnea (rest/exertional/paroxysmal nocturnal), gastrointestinal bleeding (melena, rectal bleeding), abdominal pain, significant heartburn, bowel changes, GU symptoms (dysuria, hematuria, incontinence), Gyn symptoms (abnormal  bleeding, pain),  syncope, focal weakness, memory loss, numbness & tingling, skin/hair/nail changes, abnormal bruising or bleeding, anxiety, or depression.     Objective:   Physical Exam General Appearance:    Alert, cooperative, no distress, appears stated age  Head:    Normocephalic, without obvious abnormality, atraumatic  Eyes:    PERRL, conjunctiva/corneas clear, EOM's intact, fundi    benign, both eyes  Ears:    Normal TM's and external ear canals, both ears  Nose:   Nares normal, septum midline, mucosa normal, no drainage    or sinus tenderness  Throat:   Lips, mucosa, and tongue normal; teeth and gums normal  Neck:   Supple, symmetrical, trachea midline, no adenopathy;    Thyroid: no enlargement/tenderness/nodules  Back:     Symmetric, no curvature, ROM normal, no CVA tenderness  Lungs:     Clear to auscultation bilaterally, respirations unlabored  Chest Wall:    No tenderness or deformity   Heart:    Regular rate and rhythm, S1 and S2 normal, no murmur, rub   or gallop  Breast Exam:    Deferred to GYN  Abdomen:     Soft, non-tender, bowel sounds active all four quadrants,    no masses, no organomegaly  Genitalia:    Deferred to GYN  Rectal:    Extremities:   Extremities normal, atraumatic, no cyanosis or edema  Pulses:   2+ and symmetric all extremities  Skin:   Skin color,  texture, turgor normal, no rashes or lesions  Lymph nodes:   Cervical, supraclavicular, and axillary nodes normal  Neurologic:   CNII-XII intact, normal strength, sensation and reflexes    throughout          Assessment & Plan:

## 2017-06-05 NOTE — Patient Instructions (Signed)
Follow up in 6 months to recheck BP We'll notify you of your lab results and make any changes if needed Keep up the good work on healthy diet and regular exercise- you look great! Call with any questions or concerns Have a great weekend!!!

## 2017-06-05 NOTE — Assessment & Plan Note (Signed)
Pt's PE WNL.  UTD on GYN.  Check labs.  Anticipatory guidance provided.  

## 2017-06-08 ENCOUNTER — Encounter: Payer: Self-pay | Admitting: General Practice

## 2017-08-07 ENCOUNTER — Other Ambulatory Visit: Payer: Self-pay | Admitting: Emergency Medicine

## 2017-08-07 MED ORDER — LABETALOL HCL 200 MG PO TABS: 200.0000 mg | ORAL_TABLET | Freq: Two times a day (BID) | ORAL | 1 refills | Status: DC

## 2017-11-04 ENCOUNTER — Other Ambulatory Visit: Payer: Self-pay | Admitting: Family Medicine

## 2017-11-30 ENCOUNTER — Encounter: Payer: Self-pay | Admitting: General Practice

## 2017-11-30 ENCOUNTER — Other Ambulatory Visit: Payer: Self-pay

## 2017-11-30 ENCOUNTER — Encounter: Payer: Self-pay | Admitting: Family Medicine

## 2017-11-30 ENCOUNTER — Ambulatory Visit: Payer: BLUE CROSS/BLUE SHIELD | Admitting: Family Medicine

## 2017-11-30 VITALS — BP 126/80 | HR 56 | Temp 97.9°F | Resp 15 | Ht 62.0 in | Wt 160.6 lb

## 2017-11-30 DIAGNOSIS — L564 Polymorphous light eruption: Secondary | ICD-10-CM | POA: Diagnosis not present

## 2017-11-30 DIAGNOSIS — I10 Essential (primary) hypertension: Secondary | ICD-10-CM | POA: Diagnosis not present

## 2017-11-30 LAB — BASIC METABOLIC PANEL
BUN: 16 mg/dL (ref 6–23)
CHLORIDE: 100 meq/L (ref 96–112)
CO2: 29 meq/L (ref 19–32)
Calcium: 9.5 mg/dL (ref 8.4–10.5)
Creatinine, Ser: 0.69 mg/dL (ref 0.40–1.20)
GFR: 98.36 mL/min (ref >60.00)
GLUCOSE: 109 mg/dL — AB (ref 70–99)
POTASSIUM: 3.4 meq/L — AB (ref 3.5–5.1)
Sodium: 138 mEq/L (ref 135–145)

## 2017-11-30 LAB — CBC WITH DIFFERENTIAL/PLATELET
BASOS PCT: 0.2 % (ref 0.0–3.0)
Basophils Absolute: 0 10*3/uL (ref 0.0–0.1)
EOS PCT: 2.2 % (ref 0.0–5.0)
Eosinophils Absolute: 0.1 10*3/uL (ref 0.0–0.7)
HCT: 38.9 % (ref 36.0–46.0)
Hemoglobin: 13.3 g/dL (ref 12.0–15.0)
LYMPHS ABS: 2.4 10*3/uL (ref 0.7–4.0)
Lymphocytes Relative: 39.3 % (ref 12.0–46.0)
MCHC: 34.2 g/dL (ref 30.0–36.0)
MCV: 89 fl (ref 78.0–100.0)
MONO ABS: 0.5 10*3/uL (ref 0.1–1.0)
Monocytes Relative: 8 % (ref 3.0–12.0)
NEUTROS PCT: 50.3 % (ref 43.0–77.0)
Neutro Abs: 3 10*3/uL (ref 1.4–7.7)
Platelets: 263 10*3/uL (ref 150.0–400.0)
RBC: 4.37 Mil/uL (ref 3.87–5.11)
RDW: 12.9 % (ref 11.5–15.5)
WBC: 6 10*3/uL (ref 4.0–10.5)

## 2017-11-30 MED ORDER — HYDROXYZINE HCL 50 MG PO TABS: 50.0000 mg | ORAL_TABLET | Freq: Three times a day (TID) | ORAL | 0 refills | Status: DC | PRN

## 2017-11-30 MED ORDER — TRIAMCINOLONE ACETONIDE 0.1 % EX OINT: 1.0000 "application " | TOPICAL_OINTMENT | Freq: Two times a day (BID) | CUTANEOUS | 1 refills | Status: AC

## 2017-11-30 NOTE — Patient Instructions (Signed)
Schedule your complete physical in 6 months We'll notify you of your lab results and make any changes if needed Keep up the good work on healthy diet and regular exercise- you look great! The rash you are getting from the sun is called a polymorphous light eruption.  The best defense is sun screen and sun avoidance but if the rash does occur, use the triamcinolone ointment twice daily, take the hydroxyzine as needed for itching. Call with any questions or concerns Enjoy back to school!!!   Polymorphous Light Eruption Polymorphous light eruption is a skin reaction to rays of energy that are found in sunlight (ultraviolet radiation, or UV radiation). Tanning beds and sunlamps also use UV radiation. Polymorphous light eruption can cause itchy, red patches to develop on skin that is exposed to UV radiation. The patches usually appear immediately after or within a few hours of being in the sun or after using a tanning bed or sunlamp. They often go away on their own within several days or weeks. What are the causes? The cause of this condition is not known. What increases the risk? You are more likely to develop this condition if:  You are a woman.  You have light-colored skin (light complexion).  You live in a [place].  You have a family history of this condition.  What are the signs or symptoms? Symptoms of this condition include:  Small bumps on the skin.  Itchy patches on the skin.  Redness or scales on the skin.  A feeling of burning on the skin.  Swelling. This is rare.  Blisters. These are rare.  Chills, headache, nausea, and a general feeling of illness (malaise). These symptoms are rare.  How is this diagnosed? This condition may be diagnosed based on:  A physical exam.  Your medical history.  Phototests. These are tests to check your skin's reaction to a type of ultraviolet light (UVA light).  How is this treated? Polymorphous light eruption may clear up  without treatment. Treatment for this condition may include:  Protecting your skin from the sun with clothing and sunscreen.  Avoiding sunlight between the hours of 11 a.m. and 2 p.m.  Applying medicines to the skin to reduce swelling (topical corticosteroids).  Gradually spending more time in the sun to build resistance.  Oral immunosuppressive therapy, in rare cases. These are medicines taken by mouth that reduce the activity of the body's disease-fighting (immune) system.  Follow these instructions at home:  Take and apply over-the-counter and prescription medicines only as told by your health care provider.  Wear clothing to protect your skin from sunlight. This includes long sleeves and hats.  Use broad-spectrum sunscreens that have an SPF of 30 or higher. Broad-spectrum means that the sunscreen protects your skin from both types of UV light (UVA and UVB). Check product labels to make sure that the sunscreen protects against UVA and UVB light.  Follow instructions from your health care provider about being in the sun. You may need to: ? Avoid spending time in the sun between the hours of 11 a.m. and 2 p.m. UV radiation is strongest during those hours. ? Gradually spend more and more time in sunlight.  Keep all follow-up visits as told by your health care provider. This is important. Contact a health care provider if:  You have a fever.  You have severe pain, and medicines do not help. Get help right away if:  You have severe skin blistering, peeling, or bleeding.  You  have a very high fever or chills.  You have a severe headache, you feel confused, or you faint.  You have nausea or vomiting.  You develop body aches and pains. Summary  Polymorphous light eruption is a skin reaction to rays of energy found in sunlight (UV radiation). This condition can also be caused by UV radiation from tanning beds and sun lamps.  You may have itchy, red patches on your skin that go  away on their own.  You may not need treatment. If you do need treatment, it usually includes protective clothing to shield your skin from the sun, broad-spectrum sunscreens with an SPF of 30 or higher, and medicines that you apply to your skin. This information is not intended to replace advice given to you by your health care provider. Make sure you discuss any questions you have with your health care provider. Document Released: 06/27/2016 Document Revised: 06/27/2016 Document Reviewed: 06/27/2016 Elsevier Interactive Patient Education  [employer].

## 2017-11-30 NOTE — Progress Notes (Signed)
   Subjective:    Patient ID: Amy Torres, female    DOB: 1976/03/19, 41 y.o.   MRN: [id number]  HPI HTN- chronic problem, on Labetalol BID and HCTZ daily.  Pt reports feeling good.  No CP, SOB, HAs, visual changes, edema.  Rash- pt reports patches on arms bilaterally.  + itching.  Went to [facility] in June and was given prednisone to help w/ itching.  Pt reports sxs worsen w/ sun exposure- develops red bumps.   Review of Systems For ROS see HPI     Objective:   Physical Exam  Constitutional: She is oriented to person, place, and time. She appears well-developed and well-nourished. No distress.  HENT:  Head: Normocephalic and atraumatic.  Eyes: Pupils are equal, round, and reactive to light. Conjunctivae and EOM are normal.  Neck: Normal range of motion. Neck supple. No thyromegaly present.  Cardiovascular: Normal rate, regular rhythm, normal heart sounds and intact distal pulses.  No murmur heard. Pulmonary/Chest: Effort normal and breath sounds normal. No respiratory distress.  Abdominal: Soft. She exhibits no distension. There is no tenderness.  Musculoskeletal: She exhibits no edema.  Lymphadenopathy:    She has no cervical adenopathy.  Neurological: She is alert and oriented to person, place, and time.  Skin: Skin is warm and dry. Rash (pt has resolving maculopapular rash on arms bilaterally) noted.  Psychiatric: She has a normal mood and affect. Her behavior is normal.  Vitals reviewed.         Assessment & Plan:  PLE- new.  Pt's sxs of red, itchy rash occurring after sun exposure and resolving after days to a week are consistent w/ PLE.  Triamcinolone ointment given to use prn as well as hydroxyzine as needed for itching.  Handout given on dx, prevention, tx.  Pt expressed understanding and is in agreement w/ plan.

## 2017-11-30 NOTE — Assessment & Plan Note (Signed)
Chronic problem, well controlled today.  Asymptomatic.  Check labs.  No anticipated med changes.  Will follow. 

## 2017-12-27 ENCOUNTER — Other Ambulatory Visit: Payer: Self-pay | Admitting: Family Medicine

## 2018-02-03 ENCOUNTER — Other Ambulatory Visit: Payer: Self-pay | Admitting: Family Medicine

## 2018-04-05 ENCOUNTER — Other Ambulatory Visit: Payer: Self-pay | Admitting: Family Medicine

## 2018-04-05 MED ORDER — ALBUTEROL SULFATE HFA 108 (90 BASE) MCG/ACT IN AERS: 2.0000 | INHALATION_SPRAY | Freq: Four times a day (QID) | RESPIRATORY_TRACT | 3 refills | Status: DC | PRN

## 2018-05-16 ENCOUNTER — Other Ambulatory Visit: Payer: Self-pay | Admitting: Family Medicine

## 2018-06-02 ENCOUNTER — Ambulatory Visit: Payer: BLUE CROSS/BLUE SHIELD | Admitting: Family Medicine

## 2018-06-09 ENCOUNTER — Other Ambulatory Visit: Payer: Self-pay

## 2018-06-09 ENCOUNTER — Encounter: Payer: Self-pay | Admitting: Family Medicine

## 2018-06-09 ENCOUNTER — Ambulatory Visit (INDEPENDENT_AMBULATORY_CARE_PROVIDER_SITE_OTHER): Payer: No Typology Code available for payment source | Admitting: Family Medicine

## 2018-06-09 VITALS — BP 122/78 | HR 78 | Temp 98.1°F | Resp 16 | Ht 62.0 in | Wt 162.1 lb

## 2018-06-09 DIAGNOSIS — Z0000 Encounter for general adult medical examination without abnormal findings: Secondary | ICD-10-CM

## 2018-06-09 DIAGNOSIS — E663 Overweight: Secondary | ICD-10-CM | POA: Diagnosis not present

## 2018-06-09 DIAGNOSIS — E559 Vitamin D deficiency, unspecified: Secondary | ICD-10-CM | POA: Diagnosis not present

## 2018-06-09 DIAGNOSIS — Z1231 Encounter for screening mammogram for malignant neoplasm of breast: Secondary | ICD-10-CM

## 2018-06-09 DIAGNOSIS — I10 Essential (primary) hypertension: Secondary | ICD-10-CM | POA: Diagnosis not present

## 2018-06-09 LAB — CBC WITH DIFFERENTIAL/PLATELET
Basophils Absolute: 0 10*3/uL (ref 0.0–0.1)
Basophils Relative: 0.3 % (ref 0.0–3.0)
EOS ABS: 0.1 10*3/uL (ref 0.0–0.7)
Eosinophils Relative: 1.5 % (ref 0.0–5.0)
HCT: 39.1 % (ref 36.0–46.0)
HEMOGLOBIN: 13.4 g/dL (ref 12.0–15.0)
Lymphocytes Relative: 36.2 % (ref 12.0–46.0)
Lymphs Abs: 2.6 10*3/uL (ref 0.7–4.0)
MCHC: 34.4 g/dL (ref 30.0–36.0)
MCV: 87.5 fl (ref 78.0–100.0)
MONO ABS: 0.5 10*3/uL (ref 0.1–1.0)
Monocytes Relative: 7.2 % (ref 3.0–12.0)
Neutro Abs: 4 10*3/uL (ref 1.4–7.7)
Neutrophils Relative %: 54.8 % (ref 43.0–77.0)
Platelets: 266 10*3/uL (ref 150.0–400.0)
RBC: 4.47 Mil/uL (ref 3.87–5.11)
RDW: 12.7 % (ref 11.5–15.5)
WBC: 7.3 10*3/uL (ref 4.0–10.5)

## 2018-06-09 LAB — HEPATIC FUNCTION PANEL
ALBUMIN: 4.5 g/dL (ref 3.5–5.2)
ALT: 15 U/L (ref 0–35)
AST: 13 U/L (ref 0–37)
Alkaline Phosphatase: 45 U/L (ref 39–117)
Bilirubin, Direct: 0.1 mg/dL (ref 0.0–0.3)
Total Bilirubin: 0.5 mg/dL (ref 0.2–1.2)
Total Protein: 7.1 g/dL (ref 6.0–8.3)

## 2018-06-09 LAB — BASIC METABOLIC PANEL
BUN: 18 mg/dL (ref 6–23)
CHLORIDE: 102 meq/L (ref 96–112)
CO2: 28 meq/L (ref 19–32)
CREATININE: 0.71 mg/dL (ref 0.40–1.20)
Calcium: 9.2 mg/dL (ref 8.4–10.5)
GFR: 89.32 mL/min (ref >60.00)
Glucose, Bld: 117 mg/dL — ABNORMAL HIGH (ref 70–99)
Potassium: 3.7 mEq/L (ref 3.5–5.1)
Sodium: 141 mEq/L (ref 135–145)

## 2018-06-09 LAB — LIPID PANEL
Cholesterol: 174 mg/dL (ref 0–200)
HDL: 45.8 mg/dL (ref >39.00)
LDL Cholesterol: 110 mg/dL — ABNORMAL HIGH (ref 0–99)
NonHDL: 128.16
TRIGLYCERIDES: 89 mg/dL (ref 0.0–149.0)
Total CHOL/HDL Ratio: 4
VLDL: 17.8 mg/dL (ref 0.0–40.0)

## 2018-06-09 NOTE — Assessment & Plan Note (Signed)
Pt has hx of this.  Check labs and replete prn. 

## 2018-06-09 NOTE — Assessment & Plan Note (Signed)
Pt's PE WNL w/ exception of being overweight.  UTD on pap.  Due for mammo- order entered.  Check labs.  Anticipatory guidance provided.

## 2018-06-09 NOTE — Patient Instructions (Signed)
Follow up in 6 months to recheck BP We'll notify you of your lab results and make any changes if needed Continue to work on healthy diet and regular exercise- you're doing great! We'll call you with your mammogram appt Call with any questions or concerns [place]!

## 2018-06-09 NOTE — Assessment & Plan Note (Signed)
Ongoing issue for pt.  She is down 20 lbs from a few years ago.  Applauded her efforts.  Check labs to risk stratify.  Will follow.

## 2018-06-09 NOTE — Progress Notes (Signed)
   Subjective:    Patient ID: Amy Torres, female    DOB: 1975/08/07, 43 y.o.   MRN: [id number]  HPI CPE- UTD on pap, due for mammo.  UTD on Tdap.   Review of Systems Patient reports no vision/ hearing changes, adenopathy,fever, weight change,  persistant/recurrent hoarseness , swallowing issues, chest pain, palpitations, edema, persistant/recurrent cough, hemoptysis, dyspnea (rest/exertional/paroxysmal nocturnal), gastrointestinal bleeding (melena, rectal bleeding), abdominal pain, significant heartburn, bowel changes, GU symptoms (dysuria, hematuria, incontinence), Gyn symptoms (abnormal  bleeding, pain),  syncope, focal weakness, memory loss, numbness & tingling, skin/hair/nail changes, abnormal bruising or bleeding, anxiety, or depression.     Objective:   Physical Exam General Appearance:    Alert, cooperative, no distress, appears stated age  Head:    Normocephalic, without obvious abnormality, atraumatic  Eyes:    PERRL, conjunctiva/corneas clear, EOM's intact, fundi    benign, both eyes  Ears:    Normal TM's and external ear canals, both ears  Nose:   Nares normal, septum midline, mucosa normal, no drainage    or sinus tenderness  Throat:   Lips, mucosa, and tongue normal; teeth and gums normal  Neck:   Supple, symmetrical, trachea midline, no adenopathy;    Thyroid: no enlargement/tenderness/nodules  Back:     Symmetric, no curvature, ROM normal, no CVA tenderness  Lungs:     Clear to auscultation bilaterally, respirations unlabored  Chest Wall:    No tenderness or deformity   Heart:    Regular rate and rhythm, S1 and S2 normal, no murmur, rub   or gallop  Breast Exam:    Deferred to GYN  Abdomen:     Soft, non-tender, bowel sounds active all four quadrants,    no masses, no organomegaly  Genitalia:    Deferred to GYN  Rectal:    Extremities:   Extremities normal, atraumatic, no cyanosis or edema  Pulses:   2+ and symmetric all extremities  Skin:   Skin color, texture,  turgor normal, no rashes or lesions  Lymph nodes:   Cervical, supraclavicular, and axillary nodes normal  Neurologic:   CNII-XII intact, normal strength, sensation and reflexes    throughout          Assessment & Plan:

## 2018-06-09 NOTE — Assessment & Plan Note (Signed)
Chronic problem.  Well controlled.  Asymptomatic.  Check labs.  No anticipated med changes.  Will follow. 

## 2018-06-10 ENCOUNTER — Other Ambulatory Visit (INDEPENDENT_AMBULATORY_CARE_PROVIDER_SITE_OTHER): Payer: No Typology Code available for payment source

## 2018-06-10 DIAGNOSIS — R7309 Other abnormal glucose: Secondary | ICD-10-CM | POA: Diagnosis not present

## 2018-06-10 LAB — HEMOGLOBIN A1C: Hgb A1c MFr Bld: 6.2 % (ref 4.6–6.5)

## 2018-06-10 LAB — TSH: TSH: 1.06 u[IU]/mL (ref 0.35–4.50)

## 2018-06-10 LAB — VITAMIN D 25 HYDROXY (VIT D DEFICIENCY, FRACTURES): VITD: 50.5 ng/mL (ref 30.00–100.00)

## 2018-06-11 ENCOUNTER — Encounter: Payer: Self-pay | Admitting: Family Medicine

## 2018-06-27 ENCOUNTER — Encounter: Payer: Self-pay | Admitting: Family Medicine

## 2018-06-27 DIAGNOSIS — J45909 Unspecified asthma, uncomplicated: Secondary | ICD-10-CM

## 2018-06-28 MED ORDER — BECLOMETHASONE DIPROPIONATE 40 MCG/ACT IN AERS: 2.0000 | INHALATION_SPRAY | Freq: Two times a day (BID) | RESPIRATORY_TRACT | 12 refills | Status: DC

## 2018-07-16 ENCOUNTER — Ambulatory Visit: Payer: No Typology Code available for payment source

## 2018-08-07 ENCOUNTER — Other Ambulatory Visit: Payer: Self-pay | Admitting: Family Medicine

## 2018-09-07 ENCOUNTER — Ambulatory Visit: Payer: No Typology Code available for payment source

## 2018-10-24 ENCOUNTER — Encounter: Payer: Self-pay | Admitting: Family Medicine

## 2018-10-25 MED ORDER — FLUCONAZOLE 150 MG PO TABS: 150.0000 mg | ORAL_TABLET | Freq: Once | ORAL | 0 refills | Status: AC

## 2018-11-09 ENCOUNTER — Other Ambulatory Visit: Payer: Self-pay | Admitting: Family Medicine

## 2018-11-17 ENCOUNTER — Other Ambulatory Visit: Payer: Self-pay

## 2018-11-17 DIAGNOSIS — Z20822 Contact with and (suspected) exposure to covid-19: Secondary | ICD-10-CM

## 2018-11-18 LAB — NOVEL CORONAVIRUS, NAA: SARS-CoV-2, NAA: NOT DETECTED

## 2018-12-06 ENCOUNTER — Encounter: Payer: Self-pay | Admitting: Family Medicine

## 2018-12-06 ENCOUNTER — Other Ambulatory Visit: Payer: Self-pay

## 2018-12-06 ENCOUNTER — Ambulatory Visit (INDEPENDENT_AMBULATORY_CARE_PROVIDER_SITE_OTHER): Payer: No Typology Code available for payment source | Admitting: Family Medicine

## 2018-12-06 VITALS — BP 123/82 | HR 78 | Temp 98.0°F | Resp 16 | Ht 62.0 in | Wt 160.5 lb

## 2018-12-06 DIAGNOSIS — E781 Pure hyperglyceridemia: Secondary | ICD-10-CM

## 2018-12-06 DIAGNOSIS — E663 Overweight: Secondary | ICD-10-CM

## 2018-12-06 DIAGNOSIS — Z0000 Encounter for general adult medical examination without abnormal findings: Secondary | ICD-10-CM

## 2018-12-06 DIAGNOSIS — E559 Vitamin D deficiency, unspecified: Secondary | ICD-10-CM

## 2018-12-06 DIAGNOSIS — I10 Essential (primary) hypertension: Secondary | ICD-10-CM | POA: Diagnosis not present

## 2018-12-06 LAB — CBC WITH DIFFERENTIAL/PLATELET
Basophils Absolute: 0 10*3/uL (ref 0.0–0.1)
Basophils Relative: 0.4 % (ref 0.0–3.0)
Eosinophils Absolute: 0.1 10*3/uL (ref 0.0–0.7)
Eosinophils Relative: 1.9 % (ref 0.0–5.0)
HCT: 39.2 % (ref 36.0–46.0)
Hemoglobin: 13.4 g/dL (ref 12.0–15.0)
Lymphocytes Relative: 36.9 % (ref 12.0–46.0)
Lymphs Abs: 2.3 10*3/uL (ref 0.7–4.0)
MCHC: 34.1 g/dL (ref 30.0–36.0)
MCV: 88.9 fl (ref 78.0–100.0)
Monocytes Absolute: 0.6 10*3/uL (ref 0.1–1.0)
Monocytes Relative: 9.2 % (ref 3.0–12.0)
Neutro Abs: 3.1 10*3/uL (ref 1.4–7.7)
Neutrophils Relative %: 51.6 % (ref 43.0–77.0)
Platelets: 267 10*3/uL (ref 150.0–400.0)
RBC: 4.41 Mil/uL (ref 3.87–5.11)
RDW: 12.1 % (ref 11.5–15.5)
WBC: 6.1 10*3/uL (ref 4.0–10.5)

## 2018-12-06 LAB — LDL CHOLESTEROL, DIRECT: Direct LDL: 123 mg/dL

## 2018-12-06 LAB — BASIC METABOLIC PANEL
BUN: 13 mg/dL (ref 6–23)
CO2: 25 mEq/L (ref 19–32)
Calcium: 9.1 mg/dL (ref 8.4–10.5)
Chloride: 101 mEq/L (ref 96–112)
Creatinine, Ser: 0.59 mg/dL (ref 0.40–1.20)
GFR: 110.35 mL/min (ref >60.00)
Glucose, Bld: 121 mg/dL — ABNORMAL HIGH (ref 70–99)
Potassium: 3.7 mEq/L (ref 3.5–5.1)
Sodium: 137 mEq/L (ref 135–145)

## 2018-12-06 LAB — LIPID PANEL
Cholesterol: 206 mg/dL — ABNORMAL HIGH (ref 0–200)
HDL: 45 mg/dL (ref >39.00)
NonHDL: 161.25
Total CHOL/HDL Ratio: 5
Triglycerides: 217 mg/dL — ABNORMAL HIGH (ref 0.0–149.0)
VLDL: 43.4 mg/dL — ABNORMAL HIGH (ref 0.0–40.0)

## 2018-12-06 LAB — HEPATIC FUNCTION PANEL
ALT: 14 U/L (ref 0–35)
AST: 13 U/L (ref 0–37)
Albumin: 4.6 g/dL (ref 3.5–5.2)
Alkaline Phosphatase: 40 U/L (ref 39–117)
Bilirubin, Direct: 0.1 mg/dL (ref 0.0–0.3)
Total Bilirubin: 0.6 mg/dL (ref 0.2–1.2)
Total Protein: 7.1 g/dL (ref 6.0–8.3)

## 2018-12-06 NOTE — Assessment & Plan Note (Deleted)
Pt's PE WNL w/ exception of BMI.  UTD on pap.  Due for mammo- pt to schedule.  Will get flu shot at work.  Check labs.  Anticipatory guidance provided.

## 2018-12-06 NOTE — Patient Instructions (Addendum)
Follow up in 6 months to recheck BP and cholesterol We'll notify you of your lab results and make any changes if needed Continue to work on healthy diet and regular exercise- you look great! CALL AND SCHEDULE YOUR MAMMOGRAM Call with any questions or concerns Hang in there!!!y

## 2018-12-06 NOTE — Addendum Note (Signed)
Addended by: Midge Minium on: 12/06/2018 09:08 AM   Modules accepted: Level of Service

## 2018-12-06 NOTE — Assessment & Plan Note (Signed)
Pt is down another 2 lbs.  Applauded her efforts at diet and exercise.  Check labs to risk stratify.  Will follow.

## 2018-12-06 NOTE — Assessment & Plan Note (Signed)
Chronic problem.  Adequate control.  Asymptomatic.  Check labs.  No anticipated med changes.  Will follow. 

## 2018-12-06 NOTE — Assessment & Plan Note (Signed)
Check labs and replete prn. 

## 2018-12-06 NOTE — Assessment & Plan Note (Signed)
Attempting to control w/ diet and exercise.  Check labs and adjust tx plan prn.

## 2018-12-06 NOTE — Progress Notes (Addendum)
   Subjective:    Patient ID: Amy Torres, female    DOB: 12-31-1975, 43 y.o.   MRN: [id number]  HPI HTN- chronic problem.  Well controlled today on HCTZ 12.5mg  daily, Labetalol 200mg  BID.  Denies CP, SOB, HAs, visual changes, abd pain, N/V, edema.  Overweight- pt is down another 2 lbs.  Walking regularly.  Continues to watch her diet.  Hypertriglyceridemia- chronic problem.  Attempting to control w/ diet and exercise.   Review of Systems  For ROS see HPI     Objective:   Physical Exam General Appearance:    Alert, cooperative, no distress, appears stated age  Head:    Normocephalic, without obvious abnormality, atraumatic  Eyes:    PERRL, conjunctiva/corneas clear, EOM's intact, fundi    benign, both eyes  Ears:    Normal TM's and external ear canals, both ears  Nose:   Nares normal, septum midline, mucosa normal, no drainage    or sinus tenderness  Throat:   Lips, mucosa, and tongue normal; teeth and gums normal  Neck:   Supple, symmetrical, trachea midline, no adenopathy;    Thyroid: no enlargement/tenderness/nodules  Back:     Symmetric, no curvature, ROM normal, no CVA tenderness  Lungs:     Clear to auscultation bilaterally, respirations unlabored  Chest Wall:    No tenderness or deformity   Heart:    Regular rate and rhythm, S1 and S2 normal, no murmur, rub   or gallop  Breast Exam:    Deferred to GYN  Abdomen:     Soft, non-tender, bowel sounds active all four quadrants,    no masses, no organomegaly  Genitalia:    Deferred to GYN  Rectal:    Extremities:   Extremities normal, atraumatic, no cyanosis or edema  Pulses:   2+ and symmetric all extremities  Skin:   Skin color, texture, turgor normal, no rashes or lesions  Lymph nodes:   Cervical, supraclavicular, and axillary nodes normal  Neurologic:   CNII-XII intact, normal strength, sensation and reflexes    throughout          Assessment & Plan:

## 2018-12-07 ENCOUNTER — Other Ambulatory Visit (INDEPENDENT_AMBULATORY_CARE_PROVIDER_SITE_OTHER): Payer: No Typology Code available for payment source

## 2018-12-07 DIAGNOSIS — R7309 Other abnormal glucose: Secondary | ICD-10-CM

## 2018-12-07 LAB — HEMOGLOBIN A1C: Hgb A1c MFr Bld: 6.2 % (ref 4.6–6.5)

## 2018-12-07 LAB — TSH: TSH: 1.14 u[IU]/mL (ref 0.35–4.50)

## 2018-12-07 LAB — VITAMIN D 25 HYDROXY (VIT D DEFICIENCY, FRACTURES): VITD: 58.46 ng/mL (ref 30.00–100.00)

## 2018-12-15 ENCOUNTER — Other Ambulatory Visit: Payer: Self-pay | Admitting: Family Medicine

## 2018-12-17 ENCOUNTER — Other Ambulatory Visit: Payer: Self-pay | Admitting: Family Medicine

## 2018-12-17 ENCOUNTER — Encounter: Payer: Self-pay | Admitting: Family Medicine

## 2018-12-21 ENCOUNTER — Other Ambulatory Visit: Payer: Self-pay | Admitting: General Practice

## 2018-12-21 MED ORDER — LABETALOL HCL 200 MG PO TABS: 200.0000 mg | ORAL_TABLET | Freq: Two times a day (BID) | ORAL | 1 refills | Status: DC

## 2019-02-03 ENCOUNTER — Other Ambulatory Visit: Payer: Self-pay

## 2019-02-03 ENCOUNTER — Encounter: Payer: Self-pay | Admitting: Family Medicine

## 2019-02-03 ENCOUNTER — Ambulatory Visit (INDEPENDENT_AMBULATORY_CARE_PROVIDER_SITE_OTHER): Payer: No Typology Code available for payment source | Admitting: Family Medicine

## 2019-02-03 ENCOUNTER — Ambulatory Visit: Payer: No Typology Code available for payment source | Admitting: Family Medicine

## 2019-02-03 VITALS — BP 121/81 | HR 78 | Temp 98.0°F | Resp 16 | Ht 62.0 in | Wt 160.5 lb

## 2019-02-03 DIAGNOSIS — L298 Other pruritus: Secondary | ICD-10-CM

## 2019-02-03 DIAGNOSIS — L538 Other specified erythematous conditions: Secondary | ICD-10-CM | POA: Diagnosis not present

## 2019-02-03 DIAGNOSIS — M25649 Stiffness of unspecified hand, not elsewhere classified: Secondary | ICD-10-CM | POA: Diagnosis not present

## 2019-02-03 LAB — HEPATIC FUNCTION PANEL
ALT: 14 U/L (ref 0–35)
AST: 11 U/L (ref 0–37)
Albumin: 4.6 g/dL (ref 3.5–5.2)
Alkaline Phosphatase: 42 U/L (ref 39–117)
Bilirubin, Direct: 0.1 mg/dL (ref 0.0–0.3)
Total Bilirubin: 0.6 mg/dL (ref 0.2–1.2)
Total Protein: 7.4 g/dL (ref 6.0–8.3)

## 2019-02-03 LAB — CBC WITH DIFFERENTIAL/PLATELET
Basophils Absolute: 0 10*3/uL (ref 0.0–0.1)
Basophils Relative: 0.3 % (ref 0.0–3.0)
Eosinophils Absolute: 0.1 10*3/uL (ref 0.0–0.7)
Eosinophils Relative: 1.2 % (ref 0.0–5.0)
HCT: 39.9 % (ref 36.0–46.0)
Hemoglobin: 13.7 g/dL (ref 12.0–15.0)
Lymphocytes Relative: 32 % (ref 12.0–46.0)
Lymphs Abs: 2.3 10*3/uL (ref 0.7–4.0)
MCHC: 34.4 g/dL (ref 30.0–36.0)
MCV: 88.8 fl (ref 78.0–100.0)
Monocytes Absolute: 0.5 10*3/uL (ref 0.1–1.0)
Monocytes Relative: 7.4 % (ref 3.0–12.0)
Neutro Abs: 4.3 10*3/uL (ref 1.4–7.7)
Neutrophils Relative %: 59.1 % (ref 43.0–77.0)
Platelets: 268 10*3/uL (ref 150.0–400.0)
RBC: 4.49 Mil/uL (ref 3.87–5.11)
RDW: 12.4 % (ref 11.5–15.5)
WBC: 7.3 10*3/uL (ref 4.0–10.5)

## 2019-02-03 LAB — SEDIMENTATION RATE: Sed Rate: 22 mm/hr — ABNORMAL HIGH (ref 0–20)

## 2019-02-03 MED ORDER — HALCINONIDE 0.1 % EX OINT: TOPICAL_OINTMENT | CUTANEOUS | 0 refills | Status: DC

## 2019-02-03 NOTE — Progress Notes (Signed)
   Subjective:    Patient ID: Amy Torres, female    DOB: May 09, 1975, 43 y.o.   MRN: [id number]  HPI Hand rash- pt reports this occurs every fall starting 3 yrs ago.  Tends to start late August/early Sept.  Typically starts between fingers and then spreads across palms.  Resolves spontaneously by Jan/Feb.  Worsens w/ washing hands and using sanitizer.  Itchy and painful.  No peeling of hands.  Has used cortisone cream, gold bond.  No known family hx of autoimmune or skin conditions.  Notes AM joint stiffness bilaterally in hands   Review of Systems For ROS see HPI     Objective:   Physical Exam Vitals signs reviewed.  Constitutional:      General: She is not in acute distress.    Appearance: Normal appearance. She is not ill-appearing.  HENT:     Head: Normocephalic and atraumatic.  Musculoskeletal:        General: Swelling (mild puffiness of hands bilaterally) present. No tenderness or deformity.  Skin:    General: Skin is warm and dry.     Findings: Erythema (redness and cracking of interdigital webbing bilaterally and L thenar emience) present.  Neurological:     General: No focal deficit present.     Mental Status: She is alert and oriented to person, place, and time.  Psychiatric:        Mood and Affect: Mood normal.        Behavior: Behavior normal.        Thought Content: Thought content normal.           Assessment & Plan:  Palmar erythema/pruritis- new.  Pt reports this is a recurring issue each fall and will spontaneously resolve by Jan/Feb.  No relief w/ cortisone or triamcinolone creams.  No discrete rash.  Will get labs to assess for possible underlying autoimmune condition.  Start higher potency ointment.  If no improvement will need derm referral (assuming rheum markers are negative).  Joint stiffness- in hands bilaterally.  Worse in AM.  Raises question of possible autoimmune process given combination of skin findings.  Check labs.  Refer if necessary.

## 2019-02-03 NOTE — Patient Instructions (Signed)
We'll notify you of your lab results and make any changes if needed Apply the ointment twice daily to the hands Once we have the labs, we can determine the next best step Hopefully the ointment will have cleared things by then and we won't need a derm referral Call with any questions or concerns Hang in there!

## 2019-02-04 ENCOUNTER — Other Ambulatory Visit: Payer: Self-pay | Admitting: General Practice

## 2019-02-04 MED ORDER — TRIAMCINOLONE ACETONIDE 0.5 % EX OINT: 1.0000 "application " | TOPICAL_OINTMENT | Freq: Two times a day (BID) | CUTANEOUS | 1 refills | Status: DC

## 2019-02-05 LAB — ANA: Anti Nuclear Antibody (ANA): NEGATIVE

## 2019-02-07 ENCOUNTER — Other Ambulatory Visit: Payer: Self-pay | Admitting: Family Medicine

## 2019-02-07 DIAGNOSIS — L538 Other specified erythematous conditions: Secondary | ICD-10-CM

## 2019-04-19 ENCOUNTER — Encounter: Payer: Self-pay | Admitting: Family Medicine

## 2019-04-20 MED ORDER — FLUCONAZOLE 150 MG PO TABS: 150.0000 mg | ORAL_TABLET | Freq: Once | ORAL | 0 refills | Status: AC

## 2019-05-04 ENCOUNTER — Other Ambulatory Visit: Payer: Self-pay | Admitting: Family Medicine

## 2019-06-08 ENCOUNTER — Ambulatory Visit (INDEPENDENT_AMBULATORY_CARE_PROVIDER_SITE_OTHER): Payer: No Typology Code available for payment source | Admitting: Family Medicine

## 2019-06-08 ENCOUNTER — Encounter: Payer: Self-pay | Admitting: Family Medicine

## 2019-06-08 ENCOUNTER — Other Ambulatory Visit: Payer: Self-pay

## 2019-06-08 VITALS — BP 131/83 | HR 80 | Temp 97.9°F | Resp 16 | Ht 62.0 in | Wt 166.1 lb

## 2019-06-08 DIAGNOSIS — E559 Vitamin D deficiency, unspecified: Secondary | ICD-10-CM | POA: Diagnosis not present

## 2019-06-08 DIAGNOSIS — Z0000 Encounter for general adult medical examination without abnormal findings: Secondary | ICD-10-CM | POA: Diagnosis not present

## 2019-06-08 DIAGNOSIS — E669 Obesity, unspecified: Secondary | ICD-10-CM | POA: Diagnosis not present

## 2019-06-08 LAB — CBC WITH DIFFERENTIAL/PLATELET
Basophils Absolute: 0 10*3/uL (ref 0.0–0.1)
Basophils Relative: 0.2 % (ref 0.0–3.0)
Eosinophils Absolute: 0.1 10*3/uL (ref 0.0–0.7)
Eosinophils Relative: 1.1 % (ref 0.0–5.0)
HCT: 40.6 % (ref 36.0–46.0)
Hemoglobin: 13.8 g/dL (ref 12.0–15.0)
Lymphocytes Relative: 28.9 % (ref 12.0–46.0)
Lymphs Abs: 2.2 10*3/uL (ref 0.7–4.0)
MCHC: 34 g/dL (ref 30.0–36.0)
MCV: 89.9 fl (ref 78.0–100.0)
Monocytes Absolute: 0.5 10*3/uL (ref 0.1–1.0)
Monocytes Relative: 7 % (ref 3.0–12.0)
Neutro Abs: 4.7 10*3/uL (ref 1.4–7.7)
Neutrophils Relative %: 62.8 % (ref 43.0–77.0)
Platelets: 286 10*3/uL (ref 150.0–400.0)
RBC: 4.52 Mil/uL (ref 3.87–5.11)
RDW: 12.7 % (ref 11.5–15.5)
WBC: 7.4 10*3/uL (ref 4.0–10.5)

## 2019-06-08 LAB — HEPATIC FUNCTION PANEL
ALT: 13 U/L (ref 0–35)
AST: 11 U/L (ref 0–37)
Albumin: 4.3 g/dL (ref 3.5–5.2)
Alkaline Phosphatase: 40 U/L (ref 39–117)
Bilirubin, Direct: 0.1 mg/dL (ref 0.0–0.3)
Total Bilirubin: 0.6 mg/dL (ref 0.2–1.2)
Total Protein: 7.2 g/dL (ref 6.0–8.3)

## 2019-06-08 LAB — VITAMIN D 25 HYDROXY (VIT D DEFICIENCY, FRACTURES): VITD: 38.79 ng/mL (ref 30.00–100.00)

## 2019-06-08 LAB — LIPID PANEL
Cholesterol: 203 mg/dL — ABNORMAL HIGH (ref 0–200)
HDL: 47.1 mg/dL (ref >39.00)
NonHDL: 156.01
Total CHOL/HDL Ratio: 4
Triglycerides: 270 mg/dL — ABNORMAL HIGH (ref 0.0–149.0)
VLDL: 54 mg/dL — ABNORMAL HIGH (ref 0.0–40.0)

## 2019-06-08 LAB — BASIC METABOLIC PANEL
BUN: 10 mg/dL (ref 6–23)
CO2: 27 mEq/L (ref 19–32)
Calcium: 9.6 mg/dL (ref 8.4–10.5)
Chloride: 99 mEq/L (ref 96–112)
Creatinine, Ser: 0.63 mg/dL (ref 0.40–1.20)
GFR: 102.07 mL/min (ref >60.00)
Glucose, Bld: 107 mg/dL — ABNORMAL HIGH (ref 70–99)
Potassium: 3.8 mEq/L (ref 3.5–5.1)
Sodium: 137 mEq/L (ref 135–145)

## 2019-06-08 LAB — TSH: TSH: 1.05 u[IU]/mL (ref 0.35–4.50)

## 2019-06-08 LAB — LDL CHOLESTEROL, DIRECT: Direct LDL: 121 mg/dL

## 2019-06-08 NOTE — Progress Notes (Signed)
   Subjective:    Patient ID: Amy Torres, female    DOB: 03/22/1976, 44 y.o.   MRN: [id number]  HPI CPE- UTD on pap, mammo is scheduled.  UTD on Tdap.  Declines flu.  Pt has gained 5 lbs since last visit   Review of Systems Patient reports no hearing changes, adenopathy,fever, weight change,  persistant/recurrent hoarseness , swallowing issues, chest pain, palpitations, edema, persistant/recurrent cough, hemoptysis, dyspnea (rest/exertional/paroxysmal nocturnal), gastrointestinal bleeding (melena, rectal bleeding), abdominal pain, significant heartburn, bowel changes, GU symptoms (dysuria, hematuria, incontinence), Gyn symptoms (abnormal  bleeding, pain),  syncope, focal weakness, memory loss, numbness & tingling, skin/hair/nail changes, abnormal bruising or bleeding, anxiety, or depression.   + visual changes- due for eye exam  This visit occurred during the SARS-CoV-2 public health emergency.  Safety protocols were in place, including screening questions prior to the visit, additional usage of staff PPE, and extensive cleaning of exam room while observing appropriate contact time as indicated for disinfecting solutions.       Objective:   Physical Exam General Appearance:    Alert, cooperative, no distress, appears stated age, obese  Head:    Normocephalic, without obvious abnormality, atraumatic  Eyes:    PERRL, conjunctiva/corneas clear, EOM's intact, fundi    benign, both eyes  Ears:    Normal TM's and external ear canals, both ears  Nose:   Deferred due to COVID  Throat:   Neck:   Supple, symmetrical, trachea midline, no adenopathy;    Thyroid: no enlargement/tenderness/nodules  Back:     Symmetric, no curvature, ROM normal, no CVA tenderness  Lungs:     Clear to auscultation bilaterally, respirations unlabored  Chest Wall:    No tenderness or deformity   Heart:    Regular rate and rhythm, S1 and S2 normal, no murmur, rub   or gallop  Breast Exam:    Deferred to GYN   Abdomen:     Soft, non-tender, bowel sounds active all four quadrants,    no masses, no organomegaly  Genitalia:    Deferred to GYN  Rectal:    Extremities:   Extremities normal, atraumatic, no cyanosis or edema  Pulses:   2+ and symmetric all extremities  Skin:   Skin color, texture, turgor normal, no rashes or lesions  Lymph nodes:   Cervical, supraclavicular, and axillary nodes normal  Neurologic:   CNII-XII intact, normal strength, sensation and reflexes    throughout          Assessment & Plan:

## 2019-06-08 NOTE — Assessment & Plan Note (Signed)
Deteriorated.  Pt has gained 6 lbs.  Stressed need for healthy diet and regular exercise.  Check labs to risk stratify.

## 2019-06-08 NOTE — Assessment & Plan Note (Signed)
Pt has hx of similar.  Check labs and replete prn. °

## 2019-06-08 NOTE — Patient Instructions (Addendum)
Follow up in 6 months to recheck BP We'll notify you of your lab results and make any changes if needed Continue to work on healthy diet and regular exercise- you can do it! Schedule an eye exam at your convenience Call with any questions or concerns Stay Safe!  Stay Healthy! Hang in there!!

## 2019-06-08 NOTE — Assessment & Plan Note (Signed)
Pt's PE WNL w/ exception of obesity.  Stressed need for healthy diet and regular exercise.  Pt has mammo scheduled.  Will get pap w/ GYN.  Check labs.  Anticipatory guidance provided.

## 2019-06-19 ENCOUNTER — Other Ambulatory Visit: Payer: Self-pay | Admitting: Family Medicine

## 2019-07-13 ENCOUNTER — Ambulatory Visit
Admission: RE | Admit: 2019-07-13 | Discharge: 2019-07-13 | Disposition: A | Discharge status: Home / Self Care | Payer: No Typology Code available for payment source | Source: Ambulatory Visit | Attending: Family Medicine | Admitting: Family Medicine

## 2019-07-13 ENCOUNTER — Other Ambulatory Visit: Payer: Self-pay

## 2019-07-13 DIAGNOSIS — Z1231 Encounter for screening mammogram for malignant neoplasm of breast: Secondary | ICD-10-CM

## 2019-07-14 ENCOUNTER — Other Ambulatory Visit: Payer: Self-pay | Admitting: Family Medicine

## 2019-07-14 DIAGNOSIS — R928 Other abnormal and inconclusive findings on diagnostic imaging of breast: Secondary | ICD-10-CM

## 2019-08-05 ENCOUNTER — Other Ambulatory Visit: Payer: Self-pay | Admitting: Family Medicine

## 2019-08-05 ENCOUNTER — Ambulatory Visit
Admission: RE | Admit: 2019-08-05 | Discharge: 2019-08-05 | Disposition: A | Discharge status: Home / Self Care | Payer: No Typology Code available for payment source | Source: Ambulatory Visit | Attending: Family Medicine | Admitting: Family Medicine

## 2019-08-05 ENCOUNTER — Other Ambulatory Visit: Payer: Self-pay

## 2019-08-05 DIAGNOSIS — R928 Other abnormal and inconclusive findings on diagnostic imaging of breast: Secondary | ICD-10-CM

## 2019-08-05 DIAGNOSIS — N6310 Unspecified lump in the right breast, unspecified quadrant: Secondary | ICD-10-CM

## 2019-08-29 ENCOUNTER — Other Ambulatory Visit: Payer: Self-pay

## 2019-08-29 ENCOUNTER — Telehealth (INDEPENDENT_AMBULATORY_CARE_PROVIDER_SITE_OTHER): Payer: No Typology Code available for payment source | Admitting: Family Medicine

## 2019-08-29 ENCOUNTER — Encounter: Payer: Self-pay | Admitting: Family Medicine

## 2019-08-29 VITALS — Temp 98.5°F

## 2019-08-29 DIAGNOSIS — Z20822 Contact with and (suspected) exposure to covid-19: Secondary | ICD-10-CM | POA: Diagnosis not present

## 2019-08-29 DIAGNOSIS — J069 Acute upper respiratory infection, unspecified: Secondary | ICD-10-CM

## 2019-08-29 MED ORDER — FLUTICASONE PROPIONATE 50 MCG/ACT NA SUSP: 2.0000 | Freq: Every day | NASAL | 6 refills | Status: DC

## 2019-08-29 MED ORDER — LORATADINE 10 MG PO TABS
10.0000 mg | ORAL_TABLET | Freq: Every day | ORAL | 11 refills | Status: DC
Start: 2019-08-29 — End: 2020-08-27

## 2019-08-29 MED ORDER — GUAIFENESIN-CODEINE 100-10 MG/5ML PO SYRP: 10.0000 mL | ORAL_SOLUTION | Freq: Three times a day (TID) | ORAL | 0 refills | Status: DC | PRN

## 2019-08-29 NOTE — Progress Notes (Signed)
Virtual Visit via Video   I connected with patient on 08/29/19 at  8:00 AM EDT by a video enabled telemedicine application and verified that I am speaking with the correct person using two identifiers.  Location patient: Home Location provider: Acupuncturist, Office Persons participating in the virtual visit: Patient, Provider, [place] (Jess B)  I discussed the limitations of evaluation and management by telemedicine and the availability of in person appointments. The patient expressed understanding and agreed to proceed.  Subjective:   HPI:   URI- sore throat started Saturday night.  Now having runny nose- yellow drainage.  + cough.  No fever.  R sided sinus pressure.  + PND.  No tooth pain.  + body aches.  No changes to taste but unable to say if smell has changed due to congestion.  No known sick contacts.  Sore throat is improving.  Ran out of allergy meds (Claritin) last week.  + hx asthma.    ROS:   See pertinent positives and negatives per HPI.  Patient Active Problem List   Diagnosis Date Noted   Obesity (BMI 30-39.9) 06/08/2019   Physical exam 06/08/2019   Vitamin D deficiency 06/05/2017   Abnormal mammogram of right breast 11/21/2013   Asthma exacerbation 09/13/2012   Previous cesarean delivery, delivered 01/01/2011   ABORTION, SPONTANEOUS 04/11/2010   PROTEINURIA 06/25/2009   Hypertriglyceridemia 06/11/2009   Essential hypertension 11/06/2008   RHINITIS 11/06/2008   ASTHMA 11/06/2008    Social History   Tobacco Use   Smoking status: Never Smoker   Smokeless tobacco: Never Used  Substance Use Topics   Alcohol use: No    Current Outpatient Medications:    albuterol (VENTOLIN HFA) 108 (90 Base) MCG/ACT inhaler, Inhale 2 puffs into the lungs every 6 (six) hours as needed for wheezing or shortness of breath., Disp: 1 Inhaler, Rfl: 3   cycloSPORINE (RESTASIS) 0.05 % ophthalmic emulsion, Restasis MultiDose 0.05 % eye drops  PLACE 1 DROP INTO  BOTH EYES TWICE DAILY, Disp: , Rfl:    fluticasone (FLONASE) 50 MCG/ACT nasal spray, Place 2 sprays into both nostrils daily., Disp: 16 g, Rfl: 6   hydrochlorothiazide (MICROZIDE) 12.5 MG capsule, TAKE 1 CAPSULE BY MOUTH EVERY DAY, Disp: 90 capsule, Rfl: 1   ibuprofen (ADVIL,MOTRIN) 200 MG tablet, Take 400 mg by mouth every 6 (six) hours as needed for moderate pain., Disp: , Rfl:    labetalol (NORMODYNE) 200 MG tablet, TAKE 1 TABLET BY MOUTH TWICE A DAY, Disp: 180 tablet, Rfl: 1   Methylcobalamin 1 MG CHEW, Chew by mouth., Disp: , Rfl:    ondansetron (ZOFRAN-ODT) 8 MG disintegrating tablet, ondansetron 8 mg disintegrating tablet, Disp: , Rfl:    QVAR REDIHALER 40 MCG/ACT inhaler, TAKE 2 PUFFS BY MOUTH TWICE A DAY, Disp: , Rfl:    triamcinolone ointment (KENALOG) 0.5 %, Apply 1 application topically 2 (two) times daily., Disp: 60 g, Rfl: 1   VITAMIN D, CHOLECALCIFEROL, PO, Take by mouth., Disp: , Rfl:    hydrOXYzine (ATARAX/VISTARIL) 50 MG tablet, TAKE 1 TABLET (50 MG TOTAL) BY MOUTH 3 (THREE) TIMES DAILY AS NEEDED. (Patient not taking: Reported on 06/08/2019), Disp: 45 tablet, Rfl: 0  No Known Allergies  Objective:   Temp 98.5 F (36.9 C) (Oral)   AAOx3, NAD NCAT, EOMI No obvious CN deficits Coloring WNL Audible nasal congestion Pt is able to speak clearly, coherently without shortness of breath or increased work of breathing.  Thought process is linear.  Mood is appropriate.  Assessment and Plan:   URI/Possible COVID- new.  Pt's sxs are consistent w/ viral illness and the question then becomes is it COVID.  She is currently isolating from her family.  Will call [facility] to arrange testing.  Restart Claritin, Flonase.  Add cough syrup prn.  Reviewed supportive care.  Will enroll in home monitoring program.  Discussed red flags that should prompt immediate attention.  Pt expressed understanding and is in agreement w/ plan.    Annye Asa, MD 08/29/2019

## 2019-08-29 NOTE — Progress Notes (Signed)
I have discussed the procedure for the virtual visit with the patient who has given consent to proceed with assessment and treatment.   Pt unable to obtain vitals. Took Tylenol Severe Cold at 6am.   Davis Gourd, CMA

## 2019-11-03 ENCOUNTER — Other Ambulatory Visit: Payer: Self-pay | Admitting: Family Medicine

## 2019-12-07 ENCOUNTER — Ambulatory Visit (INDEPENDENT_AMBULATORY_CARE_PROVIDER_SITE_OTHER): Payer: No Typology Code available for payment source | Admitting: Family Medicine

## 2019-12-07 ENCOUNTER — Encounter: Payer: Self-pay | Admitting: Family Medicine

## 2019-12-07 ENCOUNTER — Other Ambulatory Visit: Payer: Self-pay

## 2019-12-07 VITALS — BP 126/80 | HR 80 | Temp 98.0°F | Resp 16 | Ht 62.0 in | Wt 164.1 lb

## 2019-12-07 DIAGNOSIS — E669 Obesity, unspecified: Secondary | ICD-10-CM | POA: Diagnosis not present

## 2019-12-07 DIAGNOSIS — I10 Essential (primary) hypertension: Secondary | ICD-10-CM | POA: Diagnosis not present

## 2019-12-07 DIAGNOSIS — E781 Pure hyperglyceridemia: Secondary | ICD-10-CM

## 2019-12-07 LAB — LIPID PANEL
Cholesterol: 203 mg/dL — ABNORMAL HIGH (ref 0–200)
HDL: 49.5 mg/dL (ref >39.00)
LDL Cholesterol: 125 mg/dL — ABNORMAL HIGH (ref 0–99)
NonHDL: 153.64
Total CHOL/HDL Ratio: 4
Triglycerides: 145 mg/dL (ref 0.0–149.0)
VLDL: 29 mg/dL (ref 0.0–40.0)

## 2019-12-07 LAB — BASIC METABOLIC PANEL
BUN: 13 mg/dL (ref 6–23)
CO2: 28 mEq/L (ref 19–32)
Calcium: 9.3 mg/dL (ref 8.4–10.5)
Chloride: 100 mEq/L (ref 96–112)
Creatinine, Ser: 0.63 mg/dL (ref 0.40–1.20)
GFR: 101.85 mL/min (ref >60.00)
Glucose, Bld: 126 mg/dL — ABNORMAL HIGH (ref 70–99)
Potassium: 3.5 mEq/L (ref 3.5–5.1)
Sodium: 137 mEq/L (ref 135–145)

## 2019-12-07 LAB — CBC WITH DIFFERENTIAL/PLATELET
Basophils Absolute: 0 10*3/uL (ref 0.0–0.1)
Basophils Relative: 0.5 % (ref 0.0–3.0)
Eosinophils Absolute: 0.1 10*3/uL (ref 0.0–0.7)
Eosinophils Relative: 1.7 % (ref 0.0–5.0)
HCT: 39.7 % (ref 36.0–46.0)
Hemoglobin: 13.3 g/dL (ref 12.0–15.0)
Lymphocytes Relative: 33.9 % (ref 12.0–46.0)
Lymphs Abs: 2.2 10*3/uL (ref 0.7–4.0)
MCHC: 33.6 g/dL (ref 30.0–36.0)
MCV: 89.3 fl (ref 78.0–100.0)
Monocytes Absolute: 0.5 10*3/uL (ref 0.1–1.0)
Monocytes Relative: 7.9 % (ref 3.0–12.0)
Neutro Abs: 3.7 10*3/uL (ref 1.4–7.7)
Neutrophils Relative %: 56 % (ref 43.0–77.0)
Platelets: 265 10*3/uL (ref 150.0–400.0)
RBC: 4.44 Mil/uL (ref 3.87–5.11)
RDW: 12.6 % (ref 11.5–15.5)
WBC: 6.6 10*3/uL (ref 4.0–10.5)

## 2019-12-07 LAB — HEPATIC FUNCTION PANEL
ALT: 18 U/L (ref 0–35)
AST: 13 U/L (ref 0–37)
Albumin: 4.4 g/dL (ref 3.5–5.2)
Alkaline Phosphatase: 41 U/L (ref 39–117)
Bilirubin, Direct: 0.1 mg/dL (ref 0.0–0.3)
Total Bilirubin: 0.7 mg/dL (ref 0.2–1.2)
Total Protein: 7.3 g/dL (ref 6.0–8.3)

## 2019-12-07 LAB — TSH: TSH: 1.26 u[IU]/mL (ref 0.35–4.50)

## 2019-12-07 NOTE — Assessment & Plan Note (Signed)
Chronic problem.  Currently well controlled.  Asymptomatic w/ exception of occasional palpitations which may be stress related.  Check labs.  No anticipated med changes.  Will follow.

## 2019-12-07 NOTE — Progress Notes (Signed)
   Subjective:    Patient ID: Amy Torres, female    DOB: [birth year], 44 y.o.   MRN: [id number]  HPI HTN- chronic problem, on HCTZ 12.5mg  daily, Labetalol 200mg  BID w/ good control.  No CP, SOB.  Occasional palpitations.  Denies HAs, visual changes.  Hyperlipidemia- chronic problem.  Attempting to control w/ diet and exercise.  Last trigs 270  Obesity- pt's BMI is 30.02  No regular exercise.  Continues to do intermittent fasting.  UTD on COVID vaccines  Review of Systems For ROS see HPI   This visit occurred during the SARS-CoV-2 public health emergency.  Safety protocols were in place, including screening questions prior to the visit, additional usage of staff PPE, and extensive cleaning of exam room while observing appropriate contact time as indicated for disinfecting solutions.       Objective:   Physical Exam Vitals reviewed.  Constitutional:      General: She is not in acute distress.    Appearance: Normal appearance. She is well-developed. She is obese.  HENT:     Head: Normocephalic and atraumatic.  Eyes:     Conjunctiva/sclera: Conjunctivae normal.     Pupils: Pupils are equal, round, and reactive to light.  Neck:     Thyroid: No thyromegaly.  Cardiovascular:     Rate and Rhythm: Normal rate and regular rhythm.     Heart sounds: Normal heart sounds. No murmur heard.   Pulmonary:     Effort: Pulmonary effort is normal. No respiratory distress.     Breath sounds: Normal breath sounds.  Abdominal:     General: There is no distension.     Palpations: Abdomen is soft.     Tenderness: There is no abdominal tenderness.  Musculoskeletal:     Cervical back: Normal range of motion and neck supple.  Lymphadenopathy:     Cervical: No cervical adenopathy.  Skin:    General: Skin is warm and dry.  Neurological:     Mental Status: She is alert and oriented to person, place, and time.  Psychiatric:        Behavior: Behavior normal.           Assessment & Plan:

## 2019-12-07 NOTE — Assessment & Plan Note (Signed)
Ongoing issue.  Discussed need for healthy diet and regular exercise.  Will continue to follow.

## 2019-12-07 NOTE — Patient Instructions (Signed)
Schedule your complete physical in 6 months We'll notify you of your lab results and make any changes if needed Continue to work on healthy diet and regular exercise- you can do it! Call with any questions or concerns Stay Safe!  Stay Healthy! 

## 2019-12-07 NOTE — Assessment & Plan Note (Signed)
Chronic problem.  Has been attempting to control w/ diet and exercise but pt admits to doing poorly on both.  Check labs and start meds prn.

## 2019-12-08 ENCOUNTER — Other Ambulatory Visit (INDEPENDENT_AMBULATORY_CARE_PROVIDER_SITE_OTHER): Payer: No Typology Code available for payment source

## 2019-12-08 DIAGNOSIS — R7309 Other abnormal glucose: Secondary | ICD-10-CM

## 2019-12-08 LAB — HEMOGLOBIN A1C: Hgb A1c MFr Bld: 6.3 % (ref 4.6–6.5)

## 2019-12-17 ENCOUNTER — Other Ambulatory Visit: Payer: Self-pay | Admitting: Family Medicine

## 2020-01-01 ENCOUNTER — Other Ambulatory Visit: Payer: Self-pay | Admitting: Family Medicine

## 2020-01-01 DIAGNOSIS — J45909 Unspecified asthma, uncomplicated: Secondary | ICD-10-CM

## 2020-01-02 ENCOUNTER — Telehealth: Payer: Self-pay | Admitting: General Practice

## 2020-01-02 MED ORDER — FLOVENT HFA 110 MCG/ACT IN AERO
2.0000 | INHALATION_SPRAY | Freq: Two times a day (BID) | RESPIRATORY_TRACT | 6 refills | Status: DC
Start: 2020-01-02 — End: 2021-10-30

## 2020-01-02 NOTE — Telephone Encounter (Signed)
Received a fax form the pharmacy that advised Qvar redihaler is not covered by pt insurance. Formulary prefers Anurity or Flovent. Please advise?

## 2020-01-02 NOTE — Addendum Note (Signed)
Addended by: Desmond Dike L on: 01/02/2020 04:00 PM   Modules accepted: Orders

## 2020-01-02 NOTE — Telephone Encounter (Signed)
Ok for [employer] HFA 118mcg, 2 puffs twice daily, 1 inhaler w/ 6 refills

## 2020-01-02 NOTE — Telephone Encounter (Signed)
Medication filled to pharmacy as requested.   

## 2020-02-08 ENCOUNTER — Ambulatory Visit
Admission: RE | Admit: 2020-02-08 | Discharge: 2020-02-08 | Disposition: A | Discharge status: Home / Self Care | Payer: No Typology Code available for payment source | Source: Ambulatory Visit | Attending: Family Medicine | Admitting: Family Medicine

## 2020-02-08 ENCOUNTER — Other Ambulatory Visit: Payer: Self-pay

## 2020-02-08 ENCOUNTER — Other Ambulatory Visit: Payer: Self-pay | Admitting: Family Medicine

## 2020-02-08 DIAGNOSIS — N6310 Unspecified lump in the right breast, unspecified quadrant: Secondary | ICD-10-CM

## 2020-02-22 ENCOUNTER — Other Ambulatory Visit: Payer: Self-pay | Admitting: Family Medicine

## 2020-04-15 ENCOUNTER — Other Ambulatory Visit: Payer: Self-pay | Admitting: Family Medicine

## 2020-04-25 ENCOUNTER — Other Ambulatory Visit: Payer: Self-pay | Admitting: Family Medicine

## 2020-06-04 ENCOUNTER — Other Ambulatory Visit: Payer: Self-pay | Admitting: Family Medicine

## 2020-06-13 ENCOUNTER — Encounter: Payer: Self-pay | Admitting: Family Medicine

## 2020-06-13 ENCOUNTER — Ambulatory Visit (INDEPENDENT_AMBULATORY_CARE_PROVIDER_SITE_OTHER): Payer: 59 | Admitting: Family Medicine

## 2020-06-13 ENCOUNTER — Other Ambulatory Visit: Payer: Self-pay

## 2020-06-13 VITALS — BP 138/82 | HR 61 | Temp 98.2°F | Resp 18 | Ht 62.0 in | Wt 163.2 lb

## 2020-06-13 DIAGNOSIS — R7303 Prediabetes: Secondary | ICD-10-CM | POA: Diagnosis not present

## 2020-06-13 DIAGNOSIS — I10 Essential (primary) hypertension: Secondary | ICD-10-CM

## 2020-06-13 DIAGNOSIS — Z1211 Encounter for screening for malignant neoplasm of colon: Secondary | ICD-10-CM

## 2020-06-13 DIAGNOSIS — E559 Vitamin D deficiency, unspecified: Secondary | ICD-10-CM

## 2020-06-13 DIAGNOSIS — Z0000 Encounter for general adult medical examination without abnormal findings: Secondary | ICD-10-CM | POA: Diagnosis not present

## 2020-06-13 LAB — CBC WITH DIFFERENTIAL/PLATELET
Basophils Absolute: 0 10*3/uL (ref 0.0–0.1)
Basophils Relative: 0.3 % (ref 0.0–3.0)
Eosinophils Absolute: 0.1 10*3/uL (ref 0.0–0.7)
Eosinophils Relative: 2.1 % (ref 0.0–5.0)
HCT: 39.2 % (ref 36.0–46.0)
Hemoglobin: 13.7 g/dL (ref 12.0–15.0)
Lymphocytes Relative: 39.9 % (ref 12.0–46.0)
Lymphs Abs: 2.3 10*3/uL (ref 0.7–4.0)
MCHC: 35 g/dL (ref 30.0–36.0)
MCV: 86.5 fl (ref 78.0–100.0)
Monocytes Absolute: 0.4 10*3/uL (ref 0.1–1.0)
Monocytes Relative: 7.8 % (ref 3.0–12.0)
Neutro Abs: 2.8 10*3/uL (ref 1.4–7.7)
Neutrophils Relative %: 49.9 % (ref 43.0–77.0)
Platelets: 286 10*3/uL (ref 150.0–400.0)
RBC: 4.53 Mil/uL (ref 3.87–5.11)
RDW: 12.2 % (ref 11.5–15.5)
WBC: 5.6 10*3/uL (ref 4.0–10.5)

## 2020-06-13 LAB — LIPID PANEL
Cholesterol: 193 mg/dL (ref 0–200)
HDL: 47.3 mg/dL (ref >39.00)
LDL Cholesterol: 107 mg/dL — ABNORMAL HIGH (ref 0–99)
NonHDL: 145.28
Total CHOL/HDL Ratio: 4
Triglycerides: 192 mg/dL — ABNORMAL HIGH (ref 0.0–149.0)
VLDL: 38.4 mg/dL (ref 0.0–40.0)

## 2020-06-13 LAB — BASIC METABOLIC PANEL
BUN: 14 mg/dL (ref 6–23)
CO2: 26 mEq/L (ref 19–32)
Calcium: 9.5 mg/dL (ref 8.4–10.5)
Chloride: 101 mEq/L (ref 96–112)
Creatinine, Ser: 0.59 mg/dL (ref 0.40–1.20)
GFR: 108.2 mL/min (ref >60.00)
Glucose, Bld: 114 mg/dL — ABNORMAL HIGH (ref 70–99)
Potassium: 3.4 mEq/L — ABNORMAL LOW (ref 3.5–5.1)
Sodium: 136 mEq/L (ref 135–145)

## 2020-06-13 LAB — HEPATIC FUNCTION PANEL
ALT: 17 U/L (ref 0–35)
AST: 17 U/L (ref 0–37)
Albumin: 4.4 g/dL (ref 3.5–5.2)
Alkaline Phosphatase: 43 U/L (ref 39–117)
Bilirubin, Direct: 0.1 mg/dL (ref 0.0–0.3)
Total Bilirubin: 0.6 mg/dL (ref 0.2–1.2)
Total Protein: 7.5 g/dL (ref 6.0–8.3)

## 2020-06-13 LAB — HEMOGLOBIN A1C: Hgb A1c MFr Bld: 6.5 % (ref 4.6–6.5)

## 2020-06-13 MED ORDER — CYCLOSPORINE 0.05 % OP EMUL: OPHTHALMIC | 3 refills | Status: DC

## 2020-06-13 NOTE — Patient Instructions (Signed)
Follow up in 6 months to recheck BP We'll notify you of your lab results and make any changes if needed Continue to work on healthy diet and regular exercise- you can do it! We'll call you with your colonoscopy referral Call with any questions or concerns Stay Safe!  Stay Healthy!

## 2020-06-13 NOTE — Assessment & Plan Note (Signed)
Pt's PE WNL w/ exception of being overweight.  UTD on pap, mammo, immunizations.  Referral for colonoscopy placed.  Check labs.  Anticipatory guidance provided.

## 2020-06-13 NOTE — Assessment & Plan Note (Signed)
Pt has hx of this.  Check labs and replete prn. 

## 2020-06-13 NOTE — Assessment & Plan Note (Signed)
Chronic problem.  Adequate but not ideal control.  Encouraged low salt diet and regular exercise.  Check labs.  Will follow.

## 2020-06-13 NOTE — Progress Notes (Signed)
   Subjective:    Patient ID: Amy Torres, female    DOB: October 20, 1975, 45 y.o.   MRN: [id number]  HPI CPE- UTD on pap, mammo, COVID, flu.  Due to start colon cancer screen.  Reviewed past medical, surgical, family and social histories.   Health Maintenance  Topic Date Due  . COVID-19 Vaccine (1) Never done  . COLONOSCOPY (Pts 45-58yrs Insurance coverage will need to be confirmed)  Never done  . INFLUENZA VACCINE  07/12/2020 (Originally 11/13/2019)  . Hepatitis C Screening  12/06/2020 (Originally 1975/11/02)  . MAMMOGRAM  07/12/2020  . TETANUS/TDAP  01/01/2021  . PAP SMEAR-Modifier  07/20/2022  . HIV Screening  Completed  . HPV VACCINES  Aged Out    Patient Care Team    Relationship Specialty Notifications Start End  Midge Minium, MD PCP - General   03/27/10   Cheri Fowler, MD Consulting Physician Obstetrics and Gynecology  05/24/15      Review of Systems Patient reports no vision/ hearing changes, adenopathy,fever, weight change,  persistant/recurrent hoarseness , swallowing issues, chest pain, palpitations, edema, persistant/recurrent cough, hemoptysis, dyspnea (rest/exertional/paroxysmal nocturnal), gastrointestinal bleeding (melena, rectal bleeding), abdominal pain, significant heartburn, bowel changes, GU symptoms (dysuria, hematuria, incontinence), Gyn symptoms (abnormal  bleeding, pain),  syncope, focal weakness, memory loss, numbness & tingling, skin/hair/nail changes, abnormal bruising or bleeding, anxiety, or depression.   This visit occurred during the SARS-CoV-2 public health emergency.  Safety protocols were in place, including screening questions prior to the visit, additional usage of staff PPE, and extensive cleaning of exam room while observing appropriate contact time as indicated for disinfecting solutions.       Objective:   Physical Exam General Appearance:    Alert, cooperative, no distress, appears stated age  Head:    Normocephalic, without obvious  abnormality, atraumatic  Eyes:    PERRL, conjunctiva/corneas clear, EOM's intact, fundi    benign, both eyes  Ears:    Normal TM's and external ear canals, both ears  Nose:   Deferred due to COVID  Throat:   Neck:   Supple, symmetrical, trachea midline, no adenopathy;    Thyroid: no enlargement/tenderness/nodules  Back:     Symmetric, no curvature, ROM normal, no CVA tenderness  Lungs:     Clear to auscultation bilaterally, respirations unlabored  Chest Wall:    No tenderness or deformity   Heart:    Regular rate and rhythm, S1 and S2 normal, no murmur, rub   or gallop  Breast Exam:    Deferred to GYN  Abdomen:     Soft, non-tender, bowel sounds active all four quadrants,    no masses, no organomegaly  Genitalia:    Deferred to GYN  Rectal:    Extremities:   Extremities normal, atraumatic, no cyanosis or edema  Pulses:   2+ and symmetric all extremities  Skin:   Skin color, texture, turgor normal, no rashes or lesions  Lymph nodes:   Cervical, supraclavicular, and axillary nodes normal  Neurologic:   CNII-XII intact, normal strength, sensation and reflexes    throughout          Assessment & Plan:

## 2020-06-14 LAB — TSH: TSH: 1.22 u[IU]/mL (ref 0.35–4.50)

## 2020-06-14 LAB — VITAMIN D 25 HYDROXY (VIT D DEFICIENCY, FRACTURES): VITD: 47.25 ng/mL (ref 30.00–100.00)

## 2020-08-08 ENCOUNTER — Ambulatory Visit
Admission: RE | Admit: 2020-08-08 | Discharge: 2020-08-08 | Disposition: A | Discharge status: Home / Self Care | Payer: 59 | Source: Ambulatory Visit | Attending: Family Medicine | Admitting: Family Medicine

## 2020-08-08 ENCOUNTER — Ambulatory Visit
Admission: RE | Admit: 2020-08-08 | Discharge: 2020-08-08 | Disposition: A | Discharge status: Home / Self Care | Payer: No Typology Code available for payment source | Source: Ambulatory Visit | Attending: Family Medicine | Admitting: Family Medicine

## 2020-08-08 ENCOUNTER — Other Ambulatory Visit: Payer: Self-pay

## 2020-08-08 DIAGNOSIS — N6310 Unspecified lump in the right breast, unspecified quadrant: Secondary | ICD-10-CM

## 2020-08-25 ENCOUNTER — Other Ambulatory Visit: Payer: Self-pay | Admitting: Family Medicine

## 2020-10-10 ENCOUNTER — Encounter: Payer: Self-pay | Admitting: *Deleted

## 2020-10-26 ENCOUNTER — Other Ambulatory Visit: Payer: Self-pay | Admitting: Family Medicine

## 2020-11-15 ENCOUNTER — Other Ambulatory Visit: Payer: Self-pay | Admitting: Family Medicine

## 2020-12-19 ENCOUNTER — Ambulatory Visit: Payer: 59 | Admitting: Family Medicine

## 2021-02-01 ENCOUNTER — Ambulatory Visit: Payer: 59 | Admitting: Family Medicine

## 2021-02-13 ENCOUNTER — Other Ambulatory Visit: Payer: Self-pay | Admitting: Family Medicine

## 2021-03-22 ENCOUNTER — Other Ambulatory Visit: Payer: Self-pay | Admitting: Family Medicine

## 2021-04-27 LAB — HM DIABETES EYE EXAM

## 2021-05-30 ENCOUNTER — Encounter: Payer: Self-pay | Admitting: Family Medicine

## 2021-05-30 ENCOUNTER — Telehealth (INDEPENDENT_AMBULATORY_CARE_PROVIDER_SITE_OTHER): Payer: No Typology Code available for payment source | Admitting: Family Medicine

## 2021-05-30 VITALS — Temp 98.8°F | Ht 62.0 in | Wt 161.0 lb

## 2021-05-30 DIAGNOSIS — J40 Bronchitis, not specified as acute or chronic: Secondary | ICD-10-CM

## 2021-05-30 MED ORDER — PROMETHAZINE-DM 6.25-15 MG/5ML PO SYRP: 5.0000 mL | ORAL_SOLUTION | Freq: Four times a day (QID) | ORAL | 0 refills | Status: DC | PRN

## 2021-05-30 MED ORDER — ALBUTEROL SULFATE HFA 108 (90 BASE) MCG/ACT IN AERS: 2.0000 | INHALATION_SPRAY | Freq: Four times a day (QID) | RESPIRATORY_TRACT | 3 refills | Status: DC | PRN

## 2021-05-30 MED ORDER — FLUCONAZOLE 150 MG PO TABS: ORAL_TABLET | ORAL | 0 refills | Status: DC

## 2021-05-30 MED ORDER — AZITHROMYCIN 250 MG PO TABS: ORAL_TABLET | ORAL | 0 refills | Status: DC

## 2021-05-30 MED ORDER — PREDNISONE 10 MG PO TABS: ORAL_TABLET | ORAL | 0 refills | Status: DC

## 2021-05-30 NOTE — Assessment & Plan Note (Signed)
abx per oreders pred taper  Cough syrup

## 2021-05-30 NOTE — Progress Notes (Signed)
MyChart Video Visit    Virtual Visit via Video Note   This visit type was conducted due to national recommendations for restrictions regarding the COVID-19 Pandemic (e.g. social distancing) in an effort to limit this patient's exposure and mitigate transmission in our community. This patient is at least at moderate risk for complications without adequate follow up. This format is felt to be most appropriate for this patient at this time. Physical exam was limited by quality of the video and audio technology used for the visit. Alinda Dooms was able to get the patient set up on a video visit.  Patient location: Home Patient and provider in visit Provider location: Office  I discussed the limitations of evaluation and management by telemedicine and the availability of in person appointments. The patient expressed understanding and agreed to proceed.  Visit Date: 05/30/2021  Today's healthcare provider: Ann Held, DO     Subjective:    Patient ID: Amy Torres, female    DOB: 1975-04-27, 46 y.o.   MRN: [id number]  Chief Complaint  Patient presents with   Nasal Congestion    X3 days, Pt states have congestion and recent fever on Tuesday. No COVID test. Pt states having asthma fare up.    HPI Patient is in today for a video visit.   She complains of low grade fever, body aches, sneezing 05/27/2021 which later developed into congestion, cough, fevers, and her asthma symptoms worsening. She does not have a fever at this time. Her cough is keeping her up at night. She is interested in taking cough syrup to help her sleep at night. She has run out of albuterol inhaler and is requesting a refill. She is using 110 mcg act Flovent HFA inhaler to manage her symptoms. She is not allergic to any known medication. She is requesting diflucan if she is taking anti-biotics. She is requesting a doctors note for her workplace.    Past Medical History:  Diagnosis Date   Asthma     Diabetes mellitus    borderline gestational   Hx gestational diabetes    Hypertension    Miscarriage    12/11   Pregnancy, complicated 7/90/2409   Previous cesarean delivery, delivered 01/01/2011    Past Surgical History:  Procedure Laterality Date   BREAST CYST EXCISION     left breast   CESAREAN SECTION      Family History  Problem Relation Age of Onset   Hypertension Mother    Stroke Mother    Cancer Maternal Aunt        breast cancer   Cancer Paternal Aunt        breast cancer   Breast cancer Paternal Aunt     Social History   Socioeconomic History   Marital status: Married    Spouse name: Not on file   Number of children: Not on file   Years of education: Not on file   Highest education level: Not on file  Occupational History   Not on file  Tobacco Use   Smoking status: Never   Smokeless tobacco: Never  Vaping Use   Vaping Use: Never used  Substance and Sexual Activity   Alcohol use: No   Drug use: No   Sexual activity: Yes  Other Topics Concern   Not on file  Social History Narrative   Not on file   Social Determinants of Health   Financial Resource Strain: Not on file  Food Insecurity: Not on  file  Transportation Needs: Not on file  Physical Activity: Not on file  Stress: Not on file  Social Connections: Not on file  Intimate Partner Violence: Not on file    Outpatient Medications Prior to Visit  Medication Sig Dispense Refill   cycloSPORINE (RESTASIS) 0.05 % ophthalmic emulsion PLACE 1 DROP INTO BOTH EYES TWICE DAILY 5.5 mL 3   fluticasone (FLONASE) 50 MCG/ACT nasal spray SPRAY 2 SPRAYS INTO EACH NOSTRIL EVERY DAY 48 mL 2   fluticasone (FLOVENT HFA) 110 MCG/ACT inhaler Inhale 2 puffs into the lungs in the morning and at bedtime. 1 each 6   guaiFENesin-codeine (ROBITUSSIN AC) 100-10 MG/5ML syrup Take 10 mLs by mouth 3 (three) times daily as needed for cough. 120 mL 0   hydrochlorothiazide (MICROZIDE) 12.5 MG capsule TAKE 1 CAPSULE BY  MOUTH EVERY DAY 90 capsule 1   ibuprofen (ADVIL,MOTRIN) 200 MG tablet Take 400 mg by mouth every 6 (six) hours as needed for moderate pain.     labetalol (NORMODYNE) 200 MG tablet TAKE 1 TABLET BY MOUTH TWICE A DAY 180 tablet 1   loratadine (CLARITIN) 10 MG tablet TAKE 1 TABLET BY MOUTH EVERY DAY 90 tablet 1   ondansetron (ZOFRAN-ODT) 8 MG disintegrating tablet ondansetron 8 mg disintegrating tablet     VITAMIN D, CHOLECALCIFEROL, PO Take by mouth.     albuterol (VENTOLIN HFA) 108 (90 Base) MCG/ACT inhaler Inhale 2 puffs into the lungs every 6 (six) hours as needed for wheezing or shortness of breath. 1 Inhaler 3   No facility-administered medications prior to visit.    No Known Allergies  Review of Systems  Constitutional:  Positive for chills, fever and malaise/fatigue.  HENT:  Positive for congestion. Negative for sore throat.   Eyes:  Negative for blurred vision.  Respiratory:  Positive for cough and wheezing. Negative for shortness of breath.   Cardiovascular:  Negative for chest pain, palpitations and leg swelling.  Gastrointestinal:  Negative for abdominal pain, blood in stool and nausea.  Genitourinary:  Negative for dysuria and frequency.  Musculoskeletal:  Negative for falls.  Skin:  Negative for rash.  Neurological:  Negative for dizziness, loss of consciousness and headaches.  Endo/Heme/Allergies:  Negative for environmental allergies.  Psychiatric/Behavioral:  Negative for depression. The patient is not nervous/anxious.       Objective:    Physical Exam Vitals and nursing note reviewed.  Constitutional:      Appearance: She is well-developed.  Pulmonary:     Effort: Pulmonary effort is normal.  Neurological:     Mental Status: She is alert and oriented to person, place, and time.    Temp 98.8 F (37.1 C)    Ht 5\' 2"  (1.575 m)    Wt 161 lb (73 kg)    BMI 29.45 kg/m  Wt Readings from Last 3 Encounters:  05/30/21 161 lb (73 kg)  06/13/20 163 lb 3.2 oz (74 kg)   12/07/19 164 lb 2 oz (74.4 kg)    Diabetic Foot Exam - Simple   No data filed    Lab Results  Component Value Date   WBC 5.6 06/13/2020   HGB 13.7 06/13/2020   HCT 39.2 06/13/2020   PLT 286.0 06/13/2020   GLUCOSE 114 (H) 06/13/2020   CHOL 193 06/13/2020   TRIG 192.0 (H) 06/13/2020   HDL 47.30 06/13/2020   LDLDIRECT 121.0 06/08/2019   LDLCALC 107 (H) 06/13/2020   ALT 17 06/13/2020   AST 17 06/13/2020   NA 136 06/13/2020  K 3.4 (L) 06/13/2020   CL 101 06/13/2020   CREATININE 0.59 06/13/2020   BUN 14 06/13/2020   CO2 26 06/13/2020   TSH 1.22 06/13/2020   HGBA1C 6.5 06/13/2020   MICROALBUR 5.7 (H) 12/31/2009    Lab Results  Component Value Date   TSH 1.22 06/13/2020   Lab Results  Component Value Date   WBC 5.6 06/13/2020   HGB 13.7 06/13/2020   HCT 39.2 06/13/2020   MCV 86.5 06/13/2020   PLT 286.0 06/13/2020   Lab Results  Component Value Date   NA 136 06/13/2020   K 3.4 (L) 06/13/2020   CO2 26 06/13/2020   GLUCOSE 114 (H) 06/13/2020   BUN 14 06/13/2020   CREATININE 0.59 06/13/2020   BILITOT 0.6 06/13/2020   ALKPHOS 43 06/13/2020   AST 17 06/13/2020   ALT 17 06/13/2020   PROT 7.5 06/13/2020   ALBUMIN 4.4 06/13/2020   CALCIUM 9.5 06/13/2020   GFR 108.20 06/13/2020   Lab Results  Component Value Date   CHOL 193 06/13/2020   Lab Results  Component Value Date   HDL 47.30 06/13/2020   Lab Results  Component Value Date   LDLCALC 107 (H) 06/13/2020   Lab Results  Component Value Date   TRIG 192.0 (H) 06/13/2020   Lab Results  Component Value Date   CHOLHDL 4 06/13/2020   Lab Results  Component Value Date   HGBA1C 6.5 06/13/2020       Assessment & Plan:   Problem List Items Addressed This Visit       Unprioritized   Bronchitis - Primary    abx per oreders pred taper  Cough syrup       Relevant Medications   azithromycin (ZITHROMAX Z-PAK) 250 MG tablet   predniSONE (DELTASONE) 10 MG tablet   promethazine-dextromethorphan  (PROMETHAZINE-DM) 6.25-15 MG/5ML syrup     Meds ordered this encounter  Medications   albuterol (VENTOLIN HFA) 108 (90 Base) MCG/ACT inhaler    Sig: Inhale 2 puffs into the lungs every 6 (six) hours as needed for wheezing or shortness of breath.    Dispense:  1 each    Refill:  3   azithromycin (ZITHROMAX Z-PAK) 250 MG tablet    Sig: As directed    Dispense:  6 each    Refill:  0   predniSONE (DELTASONE) 10 MG tablet    Sig: TAKE 3 TABLETS PO QD FOR 3 DAYS THEN TAKE 2 TABLETS PO QD FOR 3 DAYS THEN TAKE 1 TABLET PO QD FOR 3 DAYS THEN TAKE 1/2 TAB PO QD FOR 3 DAYS    Dispense:  20 tablet    Refill:  0   promethazine-dextromethorphan (PROMETHAZINE-DM) 6.25-15 MG/5ML syrup    Sig: Take 5 mLs by mouth 4 (four) times daily as needed.    Dispense:  118 mL    Refill:  0   fluconazole (DIFLUCAN) 150 MG tablet    Sig: 1 po x1, may repeat in 3 days prn    Dispense:  2 tablet    Refill:  0    I discussed the assessment and treatment plan with the patient. The patient was provided an opportunity to ask questions and all were answered. The patient agreed with the plan and demonstrated an understanding of the instructions.   The patient was advised to call back or seek an in-person evaluation if the symptoms worsen or if the condition fails to improve as anticipated.  I,Shehryar Baig,acting as a Education administrator for  Ann Held, DO.,have documented all relevant documentation on the behalf of Ann Held, DO,as directed by  Ann Held, DO while in the presence of Ann Held, DO.  I provided 20 minutes of face-to-face time during this encounter.   Ann Held, DO [place] [facility] [employer] [phone] (phone) [phone] (fax)  [place]

## 2021-06-10 ENCOUNTER — Encounter: Payer: Self-pay | Admitting: Family Medicine

## 2021-06-20 ENCOUNTER — Ambulatory Visit: Payer: No Typology Code available for payment source | Admitting: Family Medicine

## 2021-06-22 ENCOUNTER — Other Ambulatory Visit: Payer: Self-pay | Admitting: Family Medicine

## 2021-06-24 ENCOUNTER — Encounter: Payer: Self-pay | Admitting: Family Medicine

## 2021-06-25 ENCOUNTER — Other Ambulatory Visit: Payer: Self-pay

## 2021-06-25 MED ORDER — LABETALOL HCL 200 MG PO TABS: 200.0000 mg | ORAL_TABLET | Freq: Two times a day (BID) | ORAL | 0 refills | Status: DC

## 2021-07-03 ENCOUNTER — Other Ambulatory Visit: Payer: Self-pay | Admitting: Family Medicine

## 2021-07-04 ENCOUNTER — Encounter: Payer: Self-pay | Admitting: Registered Nurse

## 2021-07-04 ENCOUNTER — Other Ambulatory Visit (HOSPITAL_COMMUNITY): Payer: Self-pay

## 2021-07-04 ENCOUNTER — Ambulatory Visit (INDEPENDENT_AMBULATORY_CARE_PROVIDER_SITE_OTHER): Payer: No Typology Code available for payment source | Admitting: Registered Nurse

## 2021-07-04 VITALS — BP 136/76 | HR 68 | Temp 98.1°F | Resp 17 | Ht 62.0 in | Wt 165.4 lb

## 2021-07-04 DIAGNOSIS — Z23 Encounter for immunization: Secondary | ICD-10-CM

## 2021-07-04 DIAGNOSIS — I10 Essential (primary) hypertension: Secondary | ICD-10-CM

## 2021-07-04 MED ORDER — LABETALOL HCL 200 MG PO TABS
200.0000 mg | ORAL_TABLET | Freq: Two times a day (BID) | ORAL | 1 refills | Status: DC
  Filled 2021-07-04 – 2021-07-09 (×3): qty 180, 90d supply, fill #0
  Filled 2021-10-10: qty 180, 90d supply, fill #1

## 2021-07-04 MED ORDER — HYDROCHLOROTHIAZIDE 12.5 MG PO CAPS
12.5000 mg | ORAL_CAPSULE | Freq: Every day | ORAL | 1 refills | Status: DC
  Filled 2021-07-04: qty 90, 90d supply, fill #0
  Filled 2021-10-10: qty 90, 90d supply, fill #1

## 2021-07-04 NOTE — Assessment & Plan Note (Signed)
Stable. Improved on recheck. No symptoms. Continue current regimen. Follow up with pcp within 6 mo for CPE. ?

## 2021-07-04 NOTE — Patient Instructions (Addendum)
Ms. Hudman -  ? ?Great to meet you ? ?Call if you need anything ? ?Wellness visit within 6 mo ? ?Thanks, ? ?Rich  ? ? ? ?If you have lab work done today you will be contacted with your lab results within the next 2 weeks.  If you have not heard from [place] then please contact us. The fastest way to get your results is to register for My Chart. ? ? ?IF you received an x-ray today, you will receive an invoice from [facility]. Please contact [facility] at [phone] with questions or concerns regarding your invoice.  ? ?IF you received labwork today, you will receive an invoice from [facility]. Please contact [facility] at [phone] with questions or concerns regarding your invoice.  ? ?Our billing staff will not be able to assist you with questions regarding bills from these companies. ? ?You will be contacted with the lab results as soon as they are available. The fastest way to get your results is to activate your My Chart account. Instructions are located on the last page of this paperwork. If you have not heard from [place] regarding the results in 2 weeks, please contact this office. ?  ? ? ?

## 2021-07-04 NOTE — Progress Notes (Signed)
? ?Established Patient Office Visit ? ?Subjective:  ?Patient ID: Amy Torres, female    DOB: [birth year]  Age: 46 y.o. MRN: [id number] ? ?CC:  ?Chief Complaint  ?Patient presents with  ? Medication Refill  ?  Patient states she is here for a medication refill on BP medication.  ? ? ?HPI ?Amy Torres presents for htn ? ?Hypertension: ?Patient Currently taking: labetalol '200mg'$  po bid, hctz 12.'5mg'$  po qd.  ?Good effect. No AEs. ?Denies CV symptoms including: chest pain, shob, doe, headache, visual changes, fatigue, claudication, and dependent edema.  ? ?Previous readings and labs: ?BP Readings from Last 3 Encounters:  ?07/04/21 136/76  ?06/13/20 138/82  ?12/07/19 126/80  ? ?Lab Results  ?Component Value Date  ? CREATININE 0.59 06/13/2020  ? ? ?Due for tdap ?Would like today. ?No AE in past  ? ?Outpatient Medications Prior to Visit  ?Medication Sig Dispense Refill  ? albuterol (VENTOLIN HFA) 108 (90 Base) MCG/ACT inhaler Inhale 2 puffs into the lungs every 6 (six) hours as needed for wheezing or shortness of breath. 1 each 3  ? cycloSPORINE (RESTASIS) 0.05 % ophthalmic emulsion PLACE 1 DROP INTO BOTH EYES TWICE DAILY 5.5 mL 3  ? fluconazole (DIFLUCAN) 150 MG tablet 1 po x1, may repeat in 3 days prn 2 tablet 0  ? fluticasone (FLONASE) 50 MCG/ACT nasal spray SPRAY 2 SPRAYS INTO EACH NOSTRIL EVERY DAY 48 mL 2  ? fluticasone (FLOVENT HFA) 110 MCG/ACT inhaler Inhale 2 puffs into the lungs in the morning and at bedtime. 1 each 6  ? ibuprofen (ADVIL,MOTRIN) 200 MG tablet Take 400 mg by mouth every 6 (six) hours as needed for moderate pain.    ? loratadine (CLARITIN) 10 MG tablet TAKE 1 TABLET BY MOUTH EVERY DAY 90 tablet 1  ? ondansetron (ZOFRAN-ODT) 8 MG disintegrating tablet ondansetron 8 mg disintegrating tablet    ? VITAMIN D, CHOLECALCIFEROL, PO Take by mouth.    ? hydrochlorothiazide (MICROZIDE) 12.5 MG capsule TAKE 1 CAPSULE BY MOUTH EVERY DAY 90 capsule 1  ? labetalol (NORMODYNE) 200 MG tablet TAKE 1 TABLET BY  MOUTH TWICE A DAY 30 tablet 0  ? azithromycin (ZITHROMAX Z-PAK) 250 MG tablet As directed (Patient not taking: Reported on 07/04/2021) 6 each 0  ? guaiFENesin-codeine (ROBITUSSIN AC) 100-10 MG/5ML syrup Take 10 mLs by mouth 3 (three) times daily as needed for cough. (Patient not taking: Reported on 07/04/2021) 120 mL 0  ? predniSONE (DELTASONE) 10 MG tablet TAKE 3 TABLETS PO QD FOR 3 DAYS THEN TAKE 2 TABLETS PO QD FOR 3 DAYS THEN TAKE 1 TABLET PO QD FOR 3 DAYS THEN TAKE 1/2 TAB PO QD FOR 3 DAYS (Patient not taking: Reported on 07/04/2021) 20 tablet 0  ? promethazine-dextromethorphan (PROMETHAZINE-DM) 6.25-15 MG/5ML syrup Take 5 mLs by mouth 4 (four) times daily as needed. (Patient not taking: Reported on 07/04/2021) 118 mL 0  ? ?No facility-administered medications prior to visit.  ? ? ?Review of Systems  ?Constitutional: Negative.   ?HENT: Negative.    ?Eyes: Negative.   ?Respiratory: Negative.    ?Cardiovascular: Negative.   ?Gastrointestinal: Negative.   ?Genitourinary: Negative.   ?Musculoskeletal: Negative.   ?Skin: Negative.   ?Neurological: Negative.   ?Psychiatric/Behavioral: Negative.    ?All other systems reviewed and are negative. ? ?  ?Objective:  ?  ? ?BP 136/76 (BP Location: Left Arm, Patient Position: Sitting, Cuff Size: Normal)   Pulse 68   Temp 98.1 ?F (36.7 ?C) (Temporal)   Resp  17   Ht '5\' 2"'$  (1.575 m)   Wt 165 lb 6.4 oz (75 kg)   SpO2 100%   BMI 30.25 kg/m?  ? ?Wt Readings from Last 3 Encounters:  ?07/04/21 165 lb 6.4 oz (75 kg)  ?05/30/21 161 lb (73 kg)  ?06/13/20 163 lb 3.2 oz (74 kg)  ? ?Physical Exam ?Vitals and nursing note reviewed.  ?Constitutional:   ?   General: She is not in acute distress. ?   Appearance: Normal appearance. She is normal weight. She is not ill-appearing, toxic-appearing or diaphoretic.  ?Cardiovascular:  ?   Rate and Rhythm: Normal rate and regular rhythm.  ?   Heart sounds: Normal heart sounds. No murmur heard. ?  No friction rub. No gallop.  ?Pulmonary:  ?    Effort: Pulmonary effort is normal. No respiratory distress.  ?   Breath sounds: Normal breath sounds. No stridor. No wheezing, rhonchi or rales.  ?Chest:  ?   Chest wall: No tenderness.  ?Skin: ?   General: Skin is warm and dry.  ?Neurological:  ?   General: No focal deficit present.  ?   Mental Status: She is alert and oriented to person, place, and time. Mental status is at baseline.  ?Psychiatric:     ?   Mood and Affect: Mood normal.     ?   Behavior: Behavior normal.     ?   Thought Content: Thought content normal.     ?   Judgment: Judgment normal.  ? ? ?No results found for any visits on 07/04/21. ? ?Last metabolic panel ?Lab Results  ?Component Value Date  ? GLUCOSE 114 (H) 06/13/2020  ? NA 136 06/13/2020  ? K 3.4 (L) 06/13/2020  ? CL 101 06/13/2020  ? CO2 26 06/13/2020  ? BUN 14 06/13/2020  ? CREATININE 0.59 06/13/2020  ? GFRNONAA >90 03/05/2013  ? CALCIUM 9.5 06/13/2020  ? PROT 7.5 06/13/2020  ? ALBUMIN 4.4 06/13/2020  ? BILITOT 0.6 06/13/2020  ? ALKPHOS 43 06/13/2020  ? AST 17 06/13/2020  ? ALT 17 06/13/2020  ? ?  ? ?The 10-year ASCVD risk score (Arnett DK, et al., 2019) is: 1.7% ? ?  ?Assessment & Plan:  ? ?Problem List Items Addressed This Visit   ? ?  ? Cardiovascular and Mediastinum  ? Essential hypertension - Primary  ?  Stable. Improved on recheck. No symptoms. Continue current regimen. Follow up with pcp within 6 mo for CPE. ?  ?  ? Relevant Medications  ? labetalol (NORMODYNE) 200 MG tablet  ? hydrochlorothiazide (MICROZIDE) 12.5 MG capsule  ? ?Other Visit Diagnoses   ? ? Need for diphtheria-tetanus-pertussis (Tdap) vaccine      ? Relevant Orders  ? Tdap vaccine greater than or equal to 7yo IM  ? ?  ? ? ?Meds ordered this encounter  ?Medications  ? labetalol (NORMODYNE) 200 MG tablet  ?  Sig: Take 1 tablet (200 mg total) by mouth 2 (two) times daily.  ?  Dispense:  180 tablet  ?  Refill:  1  ?  Order Specific Question:   Supervising Provider  ?  Answer:   Carlota Raspberry, JEFFREY R [[id number]]  ?  hydrochlorothiazide (MICROZIDE) 12.5 MG capsule  ?  Sig: TAKE 1 CAPSULE BY MOUTH EVERY DAY  ?  Dispense:  90 capsule  ?  Refill:  1  ?  Order Specific Question:   Supervising Provider  ?  Answer:   Carlota Raspberry, JEFFREY R [[id number]]  ? ? ?  Return in about 6 months (around 01/04/2022) for CPE and labs with PCP.  ? ?PLAN ?- tdap given ? ?Maximiano Coss, NP ?

## 2021-07-05 ENCOUNTER — Other Ambulatory Visit (HOSPITAL_COMMUNITY): Payer: Self-pay

## 2021-07-09 ENCOUNTER — Other Ambulatory Visit (HOSPITAL_COMMUNITY): Payer: Self-pay

## 2021-07-16 ENCOUNTER — Ambulatory Visit (HOSPITAL_COMMUNITY)
Admission: EM | Admit: 2021-07-16 | Discharge: 2021-07-16 | Disposition: A | Discharge status: Home / Self Care | Payer: No Typology Code available for payment source | Attending: Student | Admitting: Student

## 2021-07-16 ENCOUNTER — Encounter (HOSPITAL_COMMUNITY): Payer: Self-pay

## 2021-07-16 DIAGNOSIS — R7303 Prediabetes: Secondary | ICD-10-CM | POA: Diagnosis not present

## 2021-07-16 LAB — CBG MONITORING, ED: Glucose-Capillary: 134 mg/dL — ABNORMAL HIGH (ref 70–99)

## 2021-07-16 NOTE — ED Triage Notes (Signed)
Pt presents with recurrent numbness and tingling in arms and legs. ? ?Pt states she is a pre diabetic ?

## 2021-07-16 NOTE — ED Provider Notes (Signed)
?[facility] ? ? ? ?CSN: [id number] ?Arrival date & time: 07/16/21  1915 ? ? ?  ? ?History   ?Chief Complaint ?Chief Complaint  ?Patient presents with  ? Numbness  ? Tingling  ? ? ?HPI ?Amy Torres is a 46 y.o. female presenting for blood sugar check following episode of numbness and tingling in the arms and legs that resolved on its own about 8 hours ago.  History prediabetes.  Patient states that she had an episode of tingling in her anterior thighs and hand that occurred while she was at work, her blood pressure was checked during the episode and was 366 systolic.  She contacted her primary care provider to request blood work and a checkup, her primary care provider advised her to go to urgent care.  Patient states that she did not actually want to go to urgent care, but her primary care insisted.  She is feeling well now, without headaches, dizziness, chest pain, weakness, numbness, tingling. ? ?HPI ? ?Past Medical History:  ?Diagnosis Date  ? Asthma   ? Diabetes mellitus   ? borderline gestational  ? Hx gestational diabetes   ? Hypertension   ? Miscarriage   ? 12/11  ? Pregnancy, complicated 4/40/3474  ? Previous cesarean delivery, delivered 01/01/2011  ? ? ?Patient Active Problem List  ? Diagnosis Date Noted  ? Bronchitis 05/30/2021  ? Physical exam 06/08/2019  ? Vitamin D deficiency 06/05/2017  ? Abnormal mammogram of right breast 11/21/2013  ? Asthma exacerbation 09/13/2012  ? Previous cesarean delivery, delivered 01/01/2011  ? ABORTION, SPONTANEOUS 04/11/2010  ? PROTEINURIA 06/25/2009  ? Hypertriglyceridemia 06/11/2009  ? Essential hypertension 11/06/2008  ? RHINITIS 11/06/2008  ? ASTHMA 11/06/2008  ? ? ?Past Surgical History:  ?Procedure Laterality Date  ? BREAST CYST EXCISION    ? left breast  ? CESAREAN SECTION    ? ? ?OB History   ? ? Gravida  ?3  ? Para  ?2  ? Term  ?2  ? Preterm  ?0  ? AB  ?1  ? Living  ?2  ?  ? ? SAB  ?1  ? IAB  ?0  ? Ectopic  ?0  ? Multiple  ?0  ? Live Births  ?2  ?   ?   ?  ? ? ? ?Home Medications   ? ?Prior to Admission medications   ?Medication Sig Start Date End Date Taking? Authorizing Provider  ?albuterol (VENTOLIN HFA) 108 (90 Base) MCG/ACT inhaler Inhale 2 puffs into the lungs every 6 (six) hours as needed for wheezing or shortness of breath. 05/30/21   Roma Schanz R, DO  ?cycloSPORINE (RESTASIS) 0.05 % ophthalmic emulsion PLACE 1 DROP INTO BOTH EYES TWICE DAILY 06/13/20   Midge Minium, MD  ?fluconazole (DIFLUCAN) 150 MG tablet 1 po x1, may repeat in 3 days prn 05/30/21   Carollee Herter, Alferd Apa, DO  ?fluticasone (FLONASE) 50 MCG/ACT nasal spray SPRAY 2 SPRAYS INTO EACH NOSTRIL EVERY DAY 02/22/20   Midge Minium, MD  ?fluticasone (FLOVENT HFA) 110 MCG/ACT inhaler Inhale 2 puffs into the lungs in the morning and at bedtime. 01/02/20   Midge Minium, MD  ?hydrochlorothiazide (MICROZIDE) 12.5 MG capsule Take 1 capsule (12.5 mg total) by mouth daily. 07/04/21   Maximiano Coss, NP  ?ibuprofen (ADVIL,MOTRIN) 200 MG tablet Take 400 mg by mouth every 6 (six) hours as needed for moderate pain.    [provider]  ?labetalol (NORMODYNE) 200 MG tablet  Take 1 tablet (200 mg total) by mouth 2 (two) times daily. 07/04/21   Maximiano Coss, NP  ?loratadine (CLARITIN) 10 MG tablet TAKE 1 TABLET BY MOUTH EVERY DAY 02/13/21   Midge Minium, MD  ?ondansetron (ZOFRAN-ODT) 8 MG disintegrating tablet ondansetron 8 mg disintegrating tablet    [provider]  ?VITAMIN D, CHOLECALCIFEROL, PO Take by mouth.    [provider]  ? ? ?Family History ?Family History  ?Problem Relation Age of Onset  ? Hypertension Mother   ? Stroke Mother   ? Cancer Maternal Aunt   ?     breast cancer  ? Cancer Paternal Aunt   ?     breast cancer  ? Breast cancer Paternal Aunt   ? ? ?Social History ?Social History  ? ?Tobacco Use  ? Smoking status: Never  ? Smokeless tobacco: Never  ?Vaping Use  ? Vaping Use: Never used  ?Substance Use Topics  ? Alcohol use: No  ? Drug  use: No  ? ? ? ?Allergies   ?Patient has no known allergies. ? ? ?Review of Systems ?Review of Systems  ?Neurological:  Negative for dizziness.  ?All other systems reviewed and are negative. ? ? ?Physical Exam ?Triage Vital Signs ?ED Triage Vitals  ?Enc Vitals Group  ?   BP 07/16/21 1950 (!) 160/89  ?   Pulse Rate 07/16/21 1950 82  ?   Resp 07/16/21 1950 17  ?   Temp 07/16/21 1950 98.2 ?F (36.8 ?C)  ?   Temp Source 07/16/21 1950 Oral  ?   SpO2 07/16/21 1950 97 %  ?   Weight --   ?   Height --   ?   Head Circumference --   ?   Peak Flow --   ?   Pain Score 07/16/21 1951 0  ?   Pain Loc --   ?   Pain Edu? --   ?   Excl. in [place]? --   ? ?No data found. ? ?Updated Vital Signs ?BP (!) 160/89 (BP Location: Left Arm)   Pulse 82   Temp 98.2 ?F (36.8 ?C) (Oral)   Resp 17   LMP 07/09/2021   SpO2 97%  ? ?Visual Acuity ?Right Eye Distance:   ?Left Eye Distance:   ?Bilateral Distance:   ? ?Right Eye Near:   ?Left Eye Near:    ?Bilateral Near:    ? ?Physical Exam ?Vitals reviewed.  ?Constitutional:   ?   General: She is not in acute distress. ?   Appearance: Normal appearance. She is not ill-appearing.  ?HENT:  ?   Head: Normocephalic and atraumatic.  ?Eyes:  ?   Extraocular Movements: Extraocular movements intact.  ?   Pupils: Pupils are equal, round, and reactive to light.  ?Cardiovascular:  ?   Rate and Rhythm: Normal rate and regular rhythm.  ?   Heart sounds: Normal heart sounds.  ?Pulmonary:  ?   Effort: Pulmonary effort is normal.  ?   Breath sounds: Normal breath sounds. No wheezing, rhonchi or rales.  ?Musculoskeletal:  ?   Cervical back: Normal range of motion and neck supple. No rigidity.  ?Lymphadenopathy:  ?   Cervical: No cervical adenopathy.  ?Skin: ?   Capillary Refill: Capillary refill takes less than 2 seconds.  ?Neurological:  ?   General: No focal deficit present.  ?   Mental Status: She is alert and oriented to person, place, and time. Mental status is at baseline.  ?  Cranial Nerves: No cranial nerve  deficit or facial asymmetry.  ?   Sensory: Sensation is intact. No sensory deficit.  ?   Motor: Motor function is intact. No weakness.  ?   Coordination: Coordination is intact. Coordination normal.  ?   Gait: Gait is intact. Gait normal.  ?   Comments: PERRLA, EOMI. CN 2-12 intact. No weakness or numbness in UEs or LEs. Negative Rhomberg, Fingers to thumb.   ?Psychiatric:     ?   Mood and Affect: Mood normal.     ?   Behavior: Behavior normal.     ?   Thought Content: Thought content normal.     ?   Judgment: Judgment normal.  ? ? ? ?UC Treatments / Results  ?Labs ?(all labs ordered are listed, but only abnormal results are displayed) ?Labs Reviewed  ?CBG MONITORING, ED - Abnormal; Notable for the following components:  ?    Result Value  ? Glucose-Capillary 134 (*)   ? All other components within normal limits  ? ? ?EKG ? ? ?Radiology ?No results found. ? ?Procedures ?Procedures (including critical care time) ? ?Medications Ordered in UC ?Medications - No data to display ? ?Initial Impression / Assessment and Plan / UC Course  ?I have reviewed the triage vital signs and the nursing notes. ? ?Pertinent labs & imaging results that were available during my care of the patient were reviewed by me and considered in my medical decision making (see chart for details). ? ?  ? ?This patient is a very pleasant 46 y.o. year old female presenting following episode of possible hypoglycemia at work that resolved on its own. Not associated with any dizziness, CP, SOB; feeling well at time of visit. Nonfasting CBG 134. Reassuring neuro exam. F/u with PCP for additional management of prediabetes. Strict ED return precautions discussed. Patient verbalizes understanding and agreement.  ? ? ?Final Clinical Impressions(s) / UC Diagnoses  ? ?Final diagnoses:  ?Prediabetes  ? ? ? ?Discharge Instructions   ? ?  ?-Your blood sugars look good right now.  ?-Follow-up with PCP to discuss long-term management ?-Head to ED if weakness or  numbness, dizziness, worst headache of life, etc  ? ? ?ED Prescriptions   ?None ?  ? ?PDMP not reviewed this encounter. ?  ?Hazel Sams, PA-C ?07/16/21 2005 ? ?

## 2021-07-16 NOTE — Discharge Instructions (Addendum)
-  Your blood sugars look good right now.  ?-Follow-up with PCP to discuss long-term management ?-Head to ED if weakness or numbness, dizziness, worst headache of life, etc  ?

## 2021-07-17 ENCOUNTER — Telehealth: Payer: Self-pay | Admitting: Family Medicine

## 2021-07-17 NOTE — Telephone Encounter (Signed)
If pt is feeling better she does not need to go to ER, she can be seen in office by a [facility] provider ?

## 2021-07-17 NOTE — Telephone Encounter (Signed)
Reason for Call Symptomatic / Request for Health Information ?Initial Comment Caller states that she is a pre-diabetic and today ?she has some tingling and numbness in her legs ?and hands. She felt like she was going to pass out. ?She did eat some peanut butter and felt better. ?Translation No ?Nurse Assessment ?Nurse: Doren Custard, RN, Caryl Pina Date/Time (Eastern Time): 07/16/2021 3:57:55 PM ?Confirm and document reason for call. If ?symptomatic, describe symptoms. ?---Caller states she is prediabetic and had tingling and ?numbness in her hands and feet today. She felt like she ?was going to pass out. She ate some peanut butter and ?felt better. ?Does the patient have any new or worsening ?symptoms? ---Yes ?Will a triage be completed? ---Yes ?Related visit to physician within the last 2 weeks? ---No ?Does the PT have any chronic conditions? (i.e. ?diabetes, asthma, this includes High risk factors for ?pregnancy, etc.) ?---Yes ?List chronic conditions. ---prediabetes, HTN ?Is the patient pregnant or possibly pregnant? (Ask ?all females between the ages of 66-55) ---No ?Is this a behavioral health or substance abuse call? ---No ?Guidelines ?Guideline Title Affirmed Question Affirmed Notes Nurse Date/Time (Eastern ?Time) ?Neurologic Deficit [1] Loss of speech or ?garbled speech AND ?[2] sudden onset ?Doren Custard, RN, Caryl Pina 07/16/2021 3:59:36 PM ?PLEASE NOTE: All timestamps contained within this report are represented as [place] Standard Time. ?CONFIDENTIALTY NOTICE: This fax transmission is intended only for the addressee. It contains information that is legally privileged, confidential or ?otherwise protected from use or disclosure. If you are not the intended recipient, you are strictly prohibited from reviewing, disclosing, copying using ?or disseminating any of this information or taking any action in reliance on or regarding this information. If you have received this fax in error, please ?notify us immediately by telephone so  that we can arrange for its return to [place]. Phone: [phone], Toll-Free: [phone], Fax: [phone] ?Page: 2 of 2 ?Call Id: [id number] ?Guidelines ?Guideline Title Affirmed Question Affirmed Notes Nurse Date/Time (Eastern ?Time) ?AND [3] brief (now ?gone) ?Disp. Time (Eastern ?Time) Disposition Final User ?07/16/2021 4:03:31 PM Go to ED Now (or PCP triage) Yes Doren Custard, RN, Caryl Pina ?Caller Disagree/Comply Disagree ?Caller Understands Yes ?PreDisposition Did not know what to do ?Care Advice Given Per Guideline ?* IF NO PCP (PRIMARY CARE PROVIDER) SECOND-LEVEL TRIAGE: You need to be seen within the next hour. Go to the ?ED/UCC at _____________ [place] as soon as you can. GO TO ED NOW (OR PCP TRIAGE): CARE ADVICE given per ?Neurologic Deficit (Adult) guideline. ?Referrals ?GO TO FACILITY UNDECIDED ?

## 2021-07-18 ENCOUNTER — Encounter: Payer: Self-pay | Admitting: Family Medicine

## 2021-07-18 ENCOUNTER — Ambulatory Visit (INDEPENDENT_AMBULATORY_CARE_PROVIDER_SITE_OTHER): Payer: No Typology Code available for payment source | Admitting: Family Medicine

## 2021-07-18 VITALS — BP 120/70 | HR 59 | Temp 97.9°F | Resp 16 | Wt 162.6 lb

## 2021-07-18 DIAGNOSIS — R202 Paresthesia of skin: Secondary | ICD-10-CM | POA: Diagnosis not present

## 2021-07-18 DIAGNOSIS — R200 Anesthesia of skin: Secondary | ICD-10-CM | POA: Diagnosis not present

## 2021-07-18 DIAGNOSIS — R7303 Prediabetes: Secondary | ICD-10-CM | POA: Diagnosis not present

## 2021-07-18 LAB — CBC WITH DIFFERENTIAL/PLATELET
Basophils Absolute: 0 10*3/uL (ref 0.0–0.1)
Basophils Relative: 0.3 % (ref 0.0–3.0)
Eosinophils Absolute: 0.1 10*3/uL (ref 0.0–0.7)
Eosinophils Relative: 2 % (ref 0.0–5.0)
HCT: 39.1 % (ref 36.0–46.0)
Hemoglobin: 13.5 g/dL (ref 12.0–15.0)
Lymphocytes Relative: 36.8 % (ref 12.0–46.0)
Lymphs Abs: 2 10*3/uL (ref 0.7–4.0)
MCHC: 34.5 g/dL (ref 30.0–36.0)
MCV: 88.7 fl (ref 78.0–100.0)
Monocytes Absolute: 0.4 10*3/uL (ref 0.1–1.0)
Monocytes Relative: 7.3 % (ref 3.0–12.0)
Neutro Abs: 2.9 10*3/uL (ref 1.4–7.7)
Neutrophils Relative %: 53.6 % (ref 43.0–77.0)
Platelets: 248 10*3/uL (ref 150.0–400.0)
RBC: 4.41 Mil/uL (ref 3.87–5.11)
RDW: 13 % (ref 11.5–15.5)
WBC: 5.5 10*3/uL (ref 4.0–10.5)

## 2021-07-18 LAB — HEPATIC FUNCTION PANEL
ALT: 32 U/L (ref 0–35)
AST: 25 U/L (ref 0–37)
Albumin: 4.6 g/dL (ref 3.5–5.2)
Alkaline Phosphatase: 40 U/L (ref 39–117)
Bilirubin, Direct: 0.2 mg/dL (ref 0.0–0.3)
Total Bilirubin: 1 mg/dL (ref 0.2–1.2)
Total Protein: 7.1 g/dL (ref 6.0–8.3)

## 2021-07-18 LAB — BASIC METABOLIC PANEL
BUN: 12 mg/dL (ref 6–23)
CO2: 30 mEq/L (ref 19–32)
Calcium: 9.4 mg/dL (ref 8.4–10.5)
Chloride: 99 mEq/L (ref 96–112)
Creatinine, Ser: 0.6 mg/dL (ref 0.40–1.20)
GFR: 106.94 mL/min (ref >60.00)
Glucose, Bld: 124 mg/dL — ABNORMAL HIGH (ref 70–99)
Potassium: 3.3 mEq/L — ABNORMAL LOW (ref 3.5–5.1)
Sodium: 138 mEq/L (ref 135–145)

## 2021-07-18 LAB — B12 AND FOLATE PANEL
Folate: 16.9 ng/mL (ref >5.9)
Vitamin B-12: 914 pg/mL — ABNORMAL HIGH (ref 211–911)

## 2021-07-18 LAB — HEMOGLOBIN A1C: Hgb A1c MFr Bld: 6.8 % — ABNORMAL HIGH (ref 4.6–6.5)

## 2021-07-18 LAB — TSH: TSH: 1.28 u[IU]/mL (ref 0.35–5.50)

## 2021-07-18 NOTE — Progress Notes (Signed)
? ?  Subjective:  ? ? Patient ID: Amy Torres, female    DOB: 03/26/1976, 45 y.o.   MRN: [id number] ? ?HPI ?ER f/u- pt went to ER on 4/4 for numbness and tingling in arms and legs that spontaneously resolved.  Also felt that she was having a hard time forming words.  BP in ER 160/89.  Glucose was 134.  Pt reports sxs mildly improved w/ PB and OJ.  Did feel clammy.  No CP, SOB, nausea, confusion.  At time of episode, had not eaten.  This was 2nd episode.  Occurred in Feb when she was sick- in the middle of the night when she got up to go the bathroom.  Pt reports feeling ok today.   ? ? ?Review of Systems ?For ROS see HPI  ?   ?Objective:  ? Physical Exam ?Vitals reviewed.  ?Constitutional:   ?   General: She is not in acute distress. ?   Appearance: She is well-developed. She is obese. She is not ill-appearing.  ?HENT:  ?   Head: Normocephalic and atraumatic.  ?Eyes:  ?   Conjunctiva/sclera: Conjunctivae normal.  ?   Pupils: Pupils are equal, round, and reactive to light.  ?Neck:  ?   Thyroid: No thyromegaly.  ?Cardiovascular:  ?   Rate and Rhythm: Normal rate and regular rhythm.  ?   Heart sounds: Normal heart sounds. No murmur heard. ?Pulmonary:  ?   Effort: Pulmonary effort is normal. No respiratory distress.  ?   Breath sounds: Normal breath sounds.  ?Abdominal:  ?   General: There is no distension.  ?   Palpations: Abdomen is soft.  ?   Tenderness: There is no abdominal tenderness.  ?Musculoskeletal:  ?   Cervical back: Normal range of motion and neck supple.  ?Lymphadenopathy:  ?   Cervical: No cervical adenopathy.  ?Skin: ?   General: Skin is warm and dry.  ?Neurological:  ?   Mental Status: She is alert and oriented to person, place, and time.  ?Psychiatric:     ?   Behavior: Behavior normal.  ? ? ? ? ? ?   ?Assessment & Plan:  ?Numbness/tingling- new.  Reviewed ER notes.  Since pt's sxs resolved, no labs were done.  Suspect this is a hypoglycemic episode/reactive hyperglycemia episode as pts sxs improve  w/ eating and occurred when she had either not yet eaten or was sick.  Will check labs to r/o underlying cause of numbness/tingling but encouraged pt to eat small amounts regularly throughout the day, increase protein intake and limit simple sugars, and increase water intake. ? ?Prediabetes- ongoing issue for pt.  Check labs to determine if things have progressed to full blown DM.  Stressed need for low carb diet but regular eating to avoid low sugars.  Encouraged regular exercise.  Will follow. ? ?

## 2021-07-18 NOTE — Patient Instructions (Signed)
Follow up as needed or as scheduled ?We'll notify you of your lab results and make any changes if needed ?Make sure you are eating regularly throughout the day to avoid your sugar dropping ?Make sure you are drinking LOTS of water ?Call with any questions or concerns ?Make sure you are taking care of you!!!  That way you can take care of others! ?Hang in there!!! ?

## 2021-07-22 ENCOUNTER — Telehealth: Payer: Self-pay

## 2021-07-24 NOTE — Telephone Encounter (Signed)
Called to schedule labs for July  ?

## 2021-08-18 ENCOUNTER — Other Ambulatory Visit: Payer: Self-pay | Admitting: Family Medicine

## 2021-09-22 IMAGING — US US BREAST*R* LIMITED INC AXILLA
1 series · 13 of 15 positions shown · non-contrast
Comparison: Previous exams including recent screening mammogram
dated 07/13/2019.

CLINICAL DATA: Patient returns today to evaluate a possible RIGHT
breast asymmetry questioned on recent screening mammogram.

EXAM:
DIGITAL DIAGNOSTIC RIGHT MAMMOGRAM WITH CAD AND TOMO
ULTRASOUND RIGHT BREAST

[Series 2: us breast*right* limited inc axilla · 0.06mm/px · 13 of 15 slices shown]
[im 1/15]
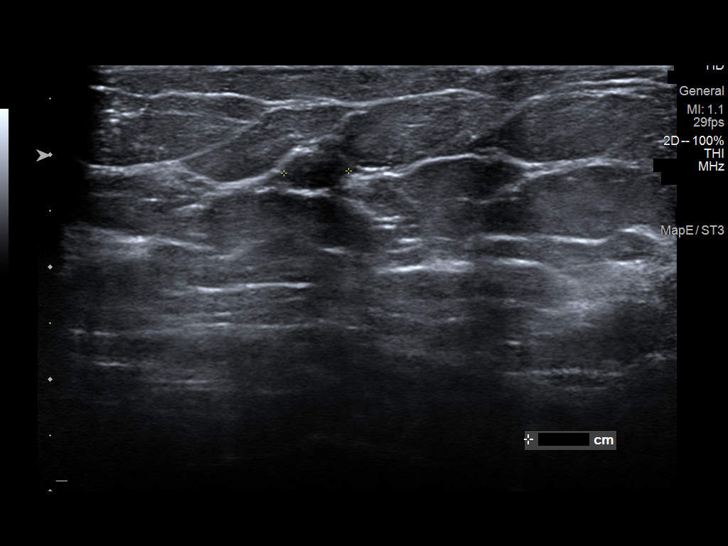
[im 2/15]
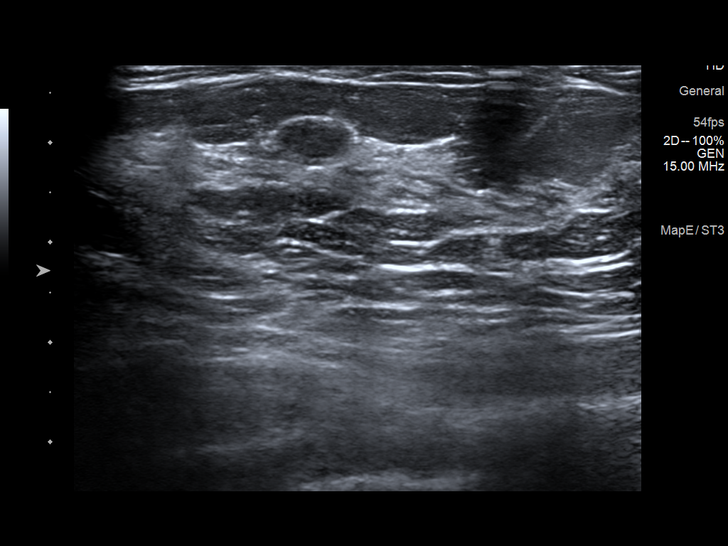
[im 3/15]
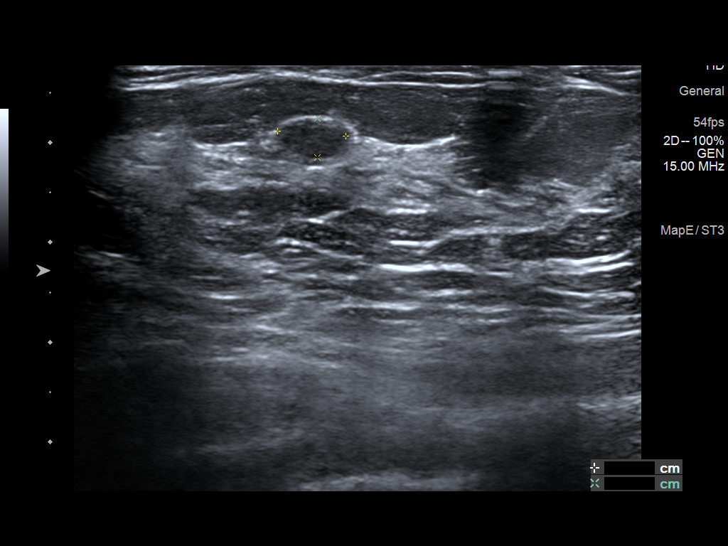
[im 5/15]
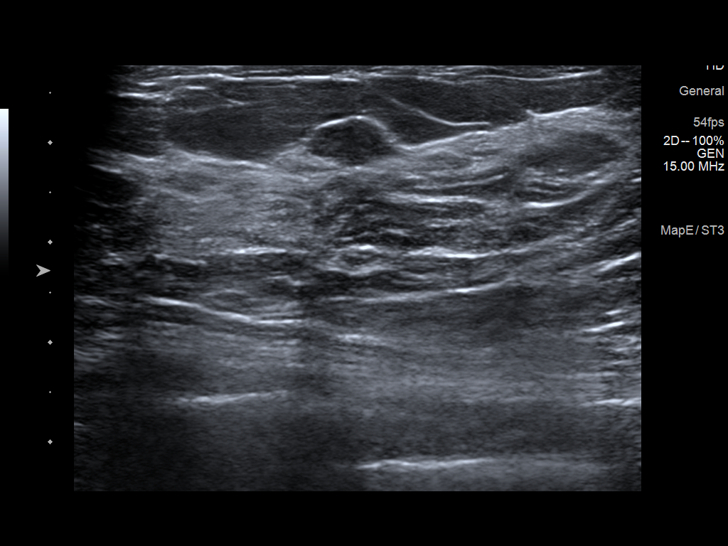
[im 6/15]
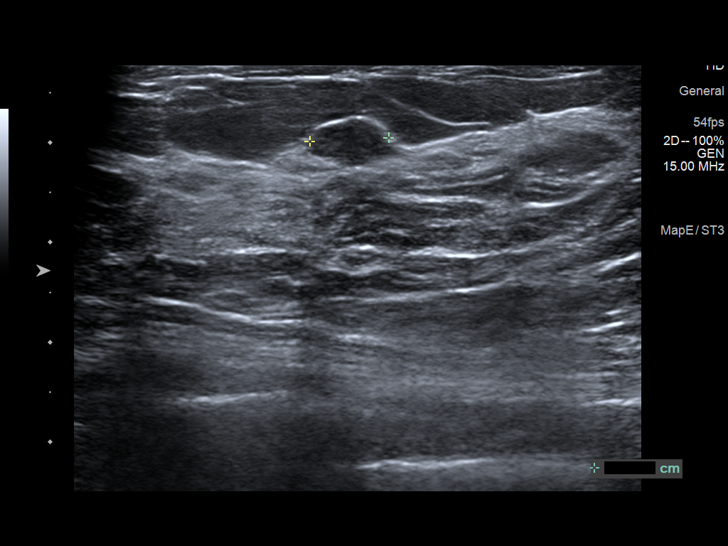
[im 7/15]
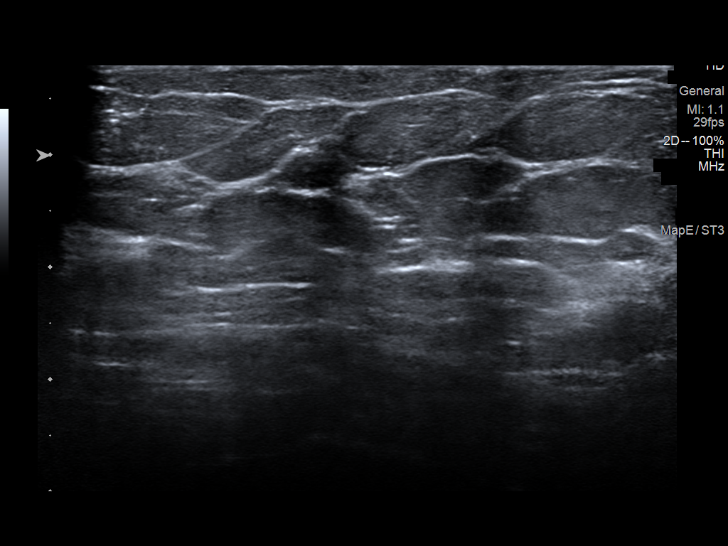
[im 8/15]
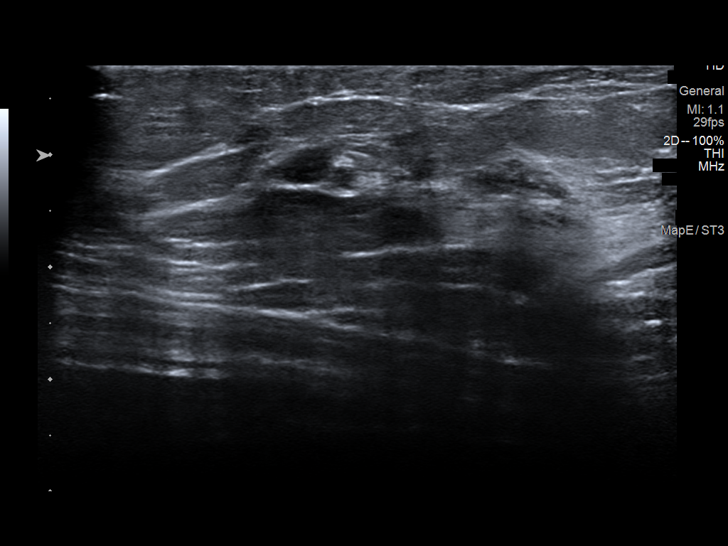
[im 9/15]
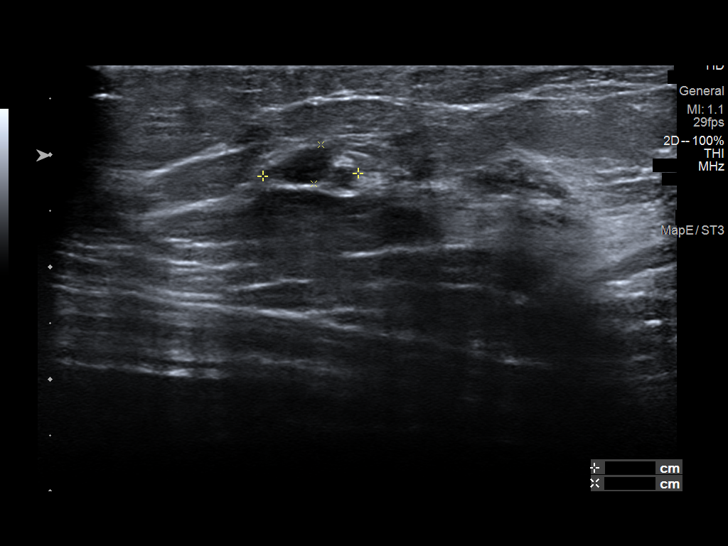
[im 10/15]
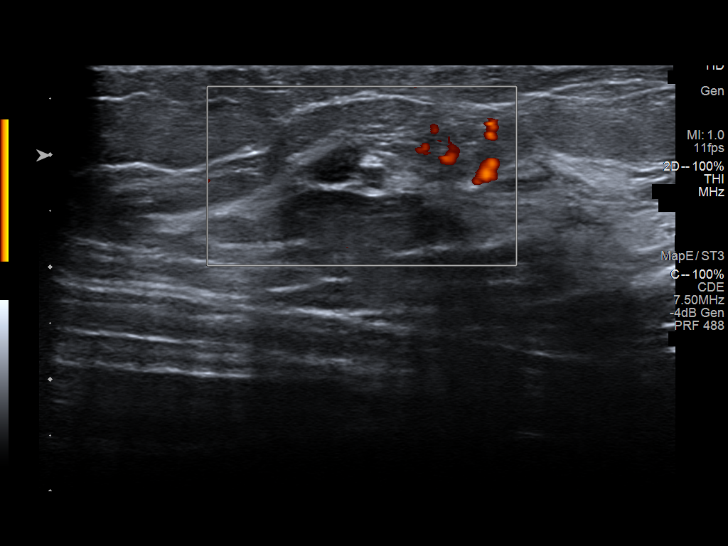
[im 11/15]
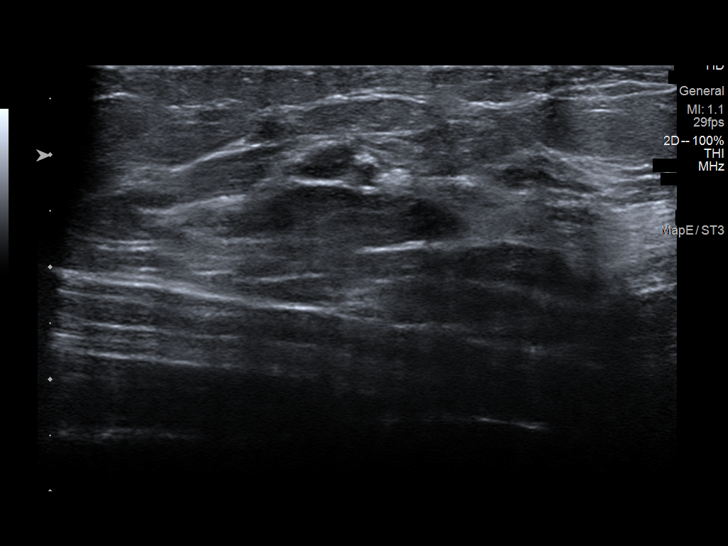
[im 13/15]
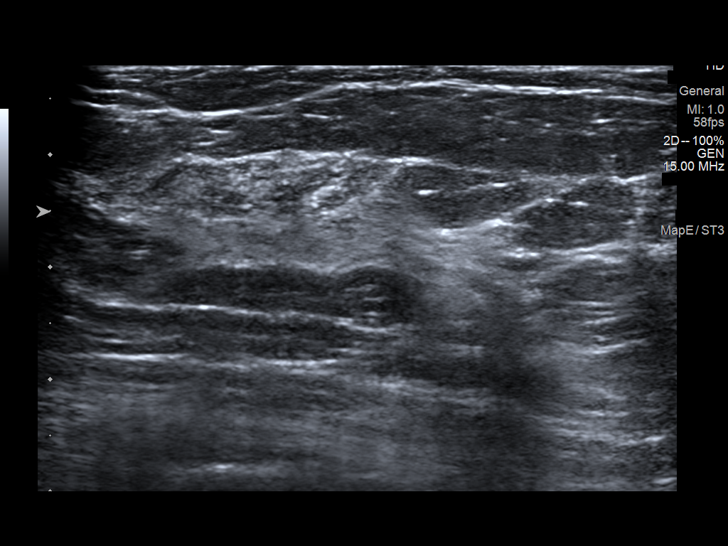
[im 14/15]
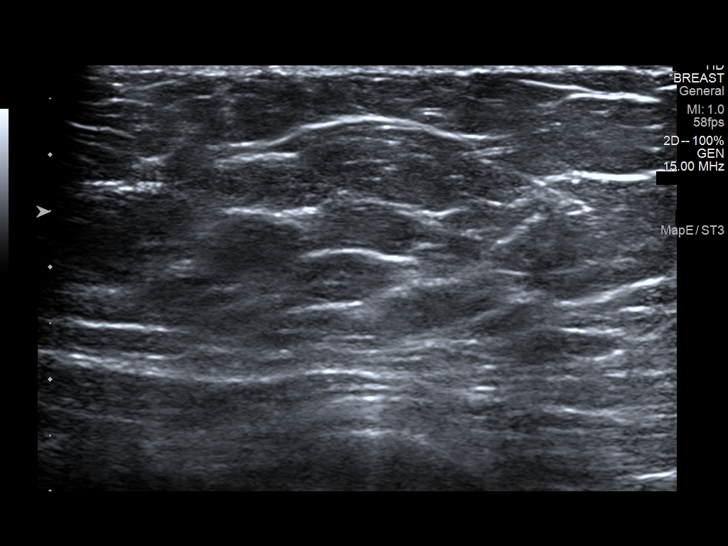
[im 15/15]
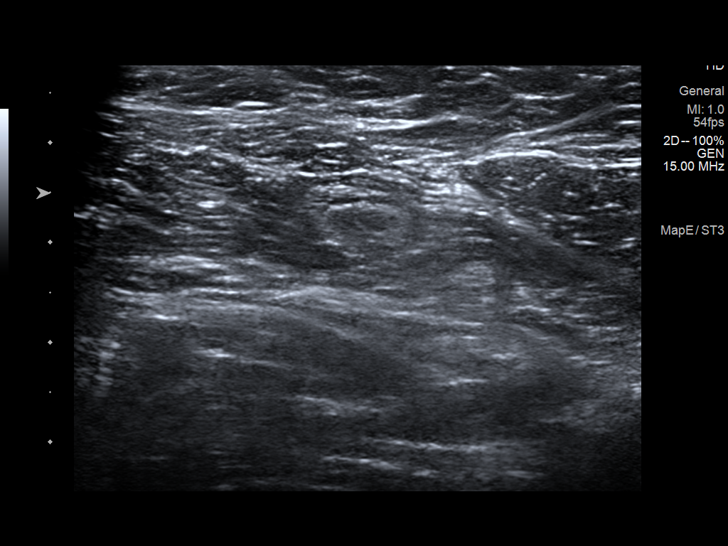

[13 of 15 positions shown; findings below may reference images not displayed]

ACR Breast Density Category c: The breast tissue is heterogeneously
dense, which may obscure small masses.
FINDINGS: On today's additional diagnostic views, the questioned asymmetry
within the upper RIGHT breast is most suggestive of superimposition
of normal dense fibroglandular tissues. Due to the heterogeneously
dense breast tissues, ultrasound will be performed to ensure
benignity.

Mammographic images were processed with CAD.

Targeted ultrasound is performed, showing an oval circumscribed
hypoechoic mass in the RIGHT breast at the 11 o'clock axis, 2 cm
from nipple, measuring 0.8 x 0.4 x 0.7 cm, likely corresponding as
incidental finding, most suggestive of a benign fibroadenoma during
real-time ultrasound evaluation.

There is an additional lobular hypoechoic mass in the RIGHT breast
at the 11:30 o'clock axis, 5 cm from the nipple,, measuring 0.9 x
0.4 x 0.6 cm, possible correlate for the mammographic finding, most
suggestive of a benign cluster of complicated cysts during real-time
ultrasound evaluation.

There is a ridge of normal dense fibroglandular tissue in the RIGHT
breast at the 12 o'clock axis, an additional possible correlate for
the mammographic finding.
IMPRESSION: 1. Probably benign mass in the RIGHT breast at the 11:30 o'clock
axis, 5 cm from the nipple, measuring 0.9 cm, most likely a benign
cluster of cysts, a possible correlate for the mammographic finding.
2. Probably benign mass in the RIGHT breast at the 11 o'clock axis,
2 cm from the nipple, measuring 0.8 cm, most suggestive of a benign
fibroadenoma, likely corresponding as an incidental finding.
3. Probably benign superimposition of normal dense fibroglandular
tissues in the RIGHT breast, 12 o'clock axis region, on diagnostic
mammogram.

RECOMMENDATION:
RIGHT breast diagnostic mammogram and ultrasound follow-up in 6
months to ensure stability of both the sonographic and mammographic
findings detailed above.

I have discussed the findings and recommendations with the patient.
If applicable, a reminder letter will be sent to the patient
regarding the next appointment.

BI-RADS CATEGORY  3: Probably benign.

## 2021-10-10 ENCOUNTER — Other Ambulatory Visit (HOSPITAL_COMMUNITY): Payer: Self-pay

## 2021-10-18 ENCOUNTER — Ambulatory Visit: Payer: No Typology Code available for payment source | Admitting: Family Medicine

## 2021-10-24 ENCOUNTER — Ambulatory Visit: Payer: No Typology Code available for payment source | Admitting: Family Medicine

## 2021-10-30 ENCOUNTER — Encounter: Payer: Self-pay | Admitting: Family Medicine

## 2021-10-30 ENCOUNTER — Other Ambulatory Visit (HOSPITAL_COMMUNITY): Payer: Self-pay

## 2021-10-30 ENCOUNTER — Ambulatory Visit (INDEPENDENT_AMBULATORY_CARE_PROVIDER_SITE_OTHER): Payer: No Typology Code available for payment source | Admitting: Family Medicine

## 2021-10-30 DIAGNOSIS — Z8632 Personal history of gestational diabetes: Secondary | ICD-10-CM | POA: Insufficient documentation

## 2021-10-30 DIAGNOSIS — E119 Type 2 diabetes mellitus without complications: Secondary | ICD-10-CM | POA: Diagnosis not present

## 2021-10-30 LAB — BASIC METABOLIC PANEL
BUN: 14 mg/dL (ref 6–23)
CO2: 29 mEq/L (ref 19–32)
Calcium: 9.2 mg/dL (ref 8.4–10.5)
Chloride: 100 mEq/L (ref 96–112)
Creatinine, Ser: 0.65 mg/dL (ref 0.40–1.20)
GFR: 104.68 mL/min (ref >60.00)
Glucose, Bld: 125 mg/dL — ABNORMAL HIGH (ref 70–99)
Potassium: 3.4 mEq/L — ABNORMAL LOW (ref 3.5–5.1)
Sodium: 139 mEq/L (ref 135–145)

## 2021-10-30 LAB — CBC WITH DIFFERENTIAL/PLATELET
Basophils Absolute: 0 10*3/uL (ref 0.0–0.1)
Basophils Relative: 0.4 % (ref 0.0–3.0)
Eosinophils Absolute: 0.1 10*3/uL (ref 0.0–0.7)
Eosinophils Relative: 2.6 % (ref 0.0–5.0)
HCT: 40.1 % (ref 36.0–46.0)
Hemoglobin: 13.6 g/dL (ref 12.0–15.0)
Lymphocytes Relative: 47.5 % — ABNORMAL HIGH (ref 12.0–46.0)
Lymphs Abs: 2.4 10*3/uL (ref 0.7–4.0)
MCHC: 34 g/dL (ref 30.0–36.0)
MCV: 88.5 fl (ref 78.0–100.0)
Monocytes Absolute: 0.4 10*3/uL (ref 0.1–1.0)
Monocytes Relative: 8.5 % (ref 3.0–12.0)
Neutro Abs: 2.1 10*3/uL (ref 1.4–7.7)
Neutrophils Relative %: 41 % — ABNORMAL LOW (ref 43.0–77.0)
Platelets: 264 10*3/uL (ref 150.0–400.0)
RBC: 4.53 Mil/uL (ref 3.87–5.11)
RDW: 13.1 % (ref 11.5–15.5)
WBC: 5.1 10*3/uL (ref 4.0–10.5)

## 2021-10-30 LAB — LIPID PANEL
Cholesterol: 188 mg/dL (ref 0–200)
HDL: 46 mg/dL (ref >39.00)
NonHDL: 142.17
Total CHOL/HDL Ratio: 4
Triglycerides: 204 mg/dL — ABNORMAL HIGH (ref 0.0–149.0)
VLDL: 40.8 mg/dL — ABNORMAL HIGH (ref 0.0–40.0)

## 2021-10-30 LAB — HEPATIC FUNCTION PANEL
ALT: 26 U/L (ref 0–35)
AST: 21 U/L (ref 0–37)
Albumin: 4.3 g/dL (ref 3.5–5.2)
Alkaline Phosphatase: 45 U/L (ref 39–117)
Bilirubin, Direct: 0.1 mg/dL (ref 0.0–0.3)
Total Bilirubin: 0.6 mg/dL (ref 0.2–1.2)
Total Protein: 7.3 g/dL (ref 6.0–8.3)

## 2021-10-30 LAB — LDL CHOLESTEROL, DIRECT: Direct LDL: 123 mg/dL

## 2021-10-30 LAB — MICROALBUMIN / CREATININE URINE RATIO
Creatinine,U: 164.4 mg/dL
Microalb Creat Ratio: 2.7 mg/g (ref 0.0–30.0)
Microalb, Ur: 4.5 mg/dL — ABNORMAL HIGH (ref 0.0–1.9)

## 2021-10-30 LAB — HEMOGLOBIN A1C: Hgb A1c MFr Bld: 6.9 % — ABNORMAL HIGH (ref 4.6–6.5)

## 2021-10-30 LAB — TSH: TSH: 1.36 u[IU]/mL (ref 0.35–5.50)

## 2021-10-30 MED ORDER — ALBUTEROL SULFATE HFA 108 (90 BASE) MCG/ACT IN AERS
2.0000 | INHALATION_SPRAY | Freq: Four times a day (QID) | RESPIRATORY_TRACT | 3 refills | Status: DC | PRN
  Filled 2021-10-30: qty 6.7, 25d supply, fill #0
  Filled 2022-04-26: qty 6.7, 25d supply, fill #1

## 2021-10-30 MED ORDER — FLUTICASONE PROPIONATE HFA 110 MCG/ACT IN AERO
2.0000 | INHALATION_SPRAY | Freq: Two times a day (BID) | RESPIRATORY_TRACT | 6 refills | Status: DC
  Filled 2021-10-30: qty 12, 30d supply, fill #0

## 2021-10-30 MED ORDER — BETAMETHASONE DIPROPIONATE 0.05 % EX CREA
TOPICAL_CREAM | Freq: Two times a day (BID) | CUTANEOUS | 1 refills | Status: DC
  Filled 2021-10-30: qty 45, 30d supply, fill #0

## 2021-10-30 NOTE — Patient Instructions (Signed)
Schedule your complete physical in 3-4 months We'll notify you of your lab results and make any changes if needed Continue to work on low carb diet and regular physical activity Call with any questions or concerns Stay Safe!  Stay Healthy! Have a great summer!!!

## 2021-10-30 NOTE — Assessment & Plan Note (Signed)
New.  Pt has hx of gestational diabetes but this is the first official dx of DMII.  UTD on eye exam- will get report.  Foot exam done today.  Will get microalbumin.  She is currently asymptomatic.  Discussed need for low carb/low sugar diet.  Pt admits she did not eat like she was supposed to while visiting family in the [place].  Check labs and determine if medication is needed.  Pt expressed understanding and is in agreement w/ plan.

## 2021-10-30 NOTE — Progress Notes (Signed)
   Subjective:    Patient ID: Amy Torres, female    DOB: 1975-10-10, 46 y.o.   MRN: [id number]  HPI DM- new.  noted at last visit when A1C 6.8%  Currently attempting to control w/ diet and exercise.  Has hx of gestational DM.  Pt recently returned from the [place] and worries that she over ate while there.  No CP, SOB, HAs, visual changes.  No abd pain, N/V.  No numbness/tingling of hands/feet.  Last eye exam- [facility]).  Pt reports walking regularly   Review of Systems For ROS see HPI     Objective:   Physical Exam Vitals reviewed.  Constitutional:      General: She is not in acute distress.    Appearance: Normal appearance. She is well-developed. She is not ill-appearing.  HENT:     Head: Normocephalic and atraumatic.  Eyes:     Conjunctiva/sclera: Conjunctivae normal.     Pupils: Pupils are equal, round, and reactive to light.  Neck:     Thyroid: No thyromegaly.  Cardiovascular:     Rate and Rhythm: Normal rate and regular rhythm.     Pulses: Normal pulses.     Heart sounds: Normal heart sounds. No murmur heard. Pulmonary:     Effort: Pulmonary effort is normal. No respiratory distress.     Breath sounds: Normal breath sounds.  Abdominal:     General: There is no distension.     Palpations: Abdomen is soft.     Tenderness: There is no abdominal tenderness.  Musculoskeletal:     Cervical back: Normal range of motion and neck supple.     Right lower leg: No edema.     Left lower leg: No edema.  Lymphadenopathy:     Cervical: No cervical adenopathy.  Skin:    General: Skin is warm and dry.  Neurological:     Mental Status: She is alert and oriented to person, place, and time.  Psychiatric:        Behavior: Behavior normal.           Assessment & Plan:

## 2021-10-31 NOTE — Progress Notes (Signed)
Informed pt of lab results  

## 2021-11-01 ENCOUNTER — Encounter: Payer: Self-pay | Admitting: Family Medicine

## 2021-12-26 ENCOUNTER — Other Ambulatory Visit (HOSPITAL_COMMUNITY): Payer: Self-pay

## 2021-12-26 ENCOUNTER — Other Ambulatory Visit: Payer: Self-pay | Admitting: Family Medicine

## 2021-12-26 MED ORDER — HYDROCHLOROTHIAZIDE 12.5 MG PO CAPS
12.5000 mg | ORAL_CAPSULE | Freq: Every day | ORAL | 0 refills | Status: DC
  Filled 2021-12-26 – 2022-02-27 (×2): qty 90, 90d supply, fill #0

## 2021-12-26 MED ORDER — LABETALOL HCL 200 MG PO TABS
200.0000 mg | ORAL_TABLET | Freq: Two times a day (BID) | ORAL | 0 refills | Status: DC
  Filled 2021-12-26: qty 180, 90d supply, fill #0

## 2022-01-06 ENCOUNTER — Ambulatory Visit: Payer: No Typology Code available for payment source | Admitting: Family Medicine

## 2022-01-16 ENCOUNTER — Encounter: Payer: Self-pay | Admitting: Family Medicine

## 2022-01-16 ENCOUNTER — Ambulatory Visit (INDEPENDENT_AMBULATORY_CARE_PROVIDER_SITE_OTHER): Payer: No Typology Code available for payment source | Admitting: Family Medicine

## 2022-01-16 VITALS — BP 136/80 | HR 58 | Temp 98.2°F | Resp 16 | Ht 62.0 in | Wt 162.4 lb

## 2022-01-16 DIAGNOSIS — Z1211 Encounter for screening for malignant neoplasm of colon: Secondary | ICD-10-CM

## 2022-01-16 DIAGNOSIS — E559 Vitamin D deficiency, unspecified: Secondary | ICD-10-CM | POA: Diagnosis not present

## 2022-01-16 DIAGNOSIS — E119 Type 2 diabetes mellitus without complications: Secondary | ICD-10-CM | POA: Diagnosis not present

## 2022-01-16 DIAGNOSIS — Z0000 Encounter for general adult medical examination without abnormal findings: Secondary | ICD-10-CM

## 2022-01-16 LAB — BASIC METABOLIC PANEL
BUN: 14 mg/dL (ref 6–23)
CO2: 28 mEq/L (ref 19–32)
Calcium: 9.2 mg/dL (ref 8.4–10.5)
Chloride: 101 mEq/L (ref 96–112)
Creatinine, Ser: 0.67 mg/dL (ref 0.40–1.20)
GFR: 103.76 mL/min (ref >60.00)
Glucose, Bld: 116 mg/dL — ABNORMAL HIGH (ref 70–99)
Potassium: 3.8 mEq/L (ref 3.5–5.1)
Sodium: 137 mEq/L (ref 135–145)

## 2022-01-16 LAB — CBC WITH DIFFERENTIAL/PLATELET
Basophils Absolute: 0 10*3/uL (ref 0.0–0.1)
Basophils Relative: 0.3 % (ref 0.0–3.0)
Eosinophils Absolute: 0.1 10*3/uL (ref 0.0–0.7)
Eosinophils Relative: 1.7 % (ref 0.0–5.0)
HCT: 40.3 % (ref 36.0–46.0)
Hemoglobin: 13.9 g/dL (ref 12.0–15.0)
Lymphocytes Relative: 37 % (ref 12.0–46.0)
Lymphs Abs: 2.1 10*3/uL (ref 0.7–4.0)
MCHC: 34.6 g/dL (ref 30.0–36.0)
MCV: 89 fl (ref 78.0–100.0)
Monocytes Absolute: 0.6 10*3/uL (ref 0.1–1.0)
Monocytes Relative: 11.6 % (ref 3.0–12.0)
Neutro Abs: 2.8 10*3/uL (ref 1.4–7.7)
Neutrophils Relative %: 49.4 % (ref 43.0–77.0)
Platelets: 248 10*3/uL (ref 150.0–400.0)
RBC: 4.53 Mil/uL (ref 3.87–5.11)
RDW: 12.6 % (ref 11.5–15.5)
WBC: 5.6 10*3/uL (ref 4.0–10.5)

## 2022-01-16 LAB — LIPID PANEL
Cholesterol: 178 mg/dL (ref 0–200)
HDL: 42.3 mg/dL (ref >39.00)
NonHDL: 135.79
Total CHOL/HDL Ratio: 4
Triglycerides: 233 mg/dL — ABNORMAL HIGH (ref 0.0–149.0)
VLDL: 46.6 mg/dL — ABNORMAL HIGH (ref 0.0–40.0)

## 2022-01-16 LAB — HEPATIC FUNCTION PANEL
ALT: 21 U/L (ref 0–35)
AST: 15 U/L (ref 0–37)
Albumin: 4.5 g/dL (ref 3.5–5.2)
Alkaline Phosphatase: 45 U/L (ref 39–117)
Bilirubin, Direct: 0.1 mg/dL (ref 0.0–0.3)
Total Bilirubin: 0.8 mg/dL (ref 0.2–1.2)
Total Protein: 7.6 g/dL (ref 6.0–8.3)

## 2022-01-16 LAB — VITAMIN D 25 HYDROXY (VIT D DEFICIENCY, FRACTURES): VITD: 46.97 ng/mL (ref 30.00–100.00)

## 2022-01-16 LAB — HEMOGLOBIN A1C: Hgb A1c MFr Bld: 6.6 % — ABNORMAL HIGH (ref 4.6–6.5)

## 2022-01-16 LAB — TSH: TSH: 1.48 u[IU]/mL (ref 0.35–5.50)

## 2022-01-16 LAB — LDL CHOLESTEROL, DIRECT: Direct LDL: 114 mg/dL

## 2022-01-16 NOTE — Progress Notes (Signed)
   Subjective:    Patient ID: Amy Torres, female    DOB: [birth year], 46 y.o.   MRN: [id number]  HPI CPE- UTD on eye exam, foot exam, microalbumin, pap.  Due for mammo, colonoscopy  Patient Care Team    Relationship Specialty Notifications Start End  Midge Minium, MD PCP - General   03/27/10   Cheri Fowler, MD Consulting Physician Obstetrics and Gynecology  05/24/15     Health Maintenance  Topic Date Due   MAMMOGRAM  08/08/2021   COLONOSCOPY (Pts 45-44yr Insurance coverage will need to be confirmed)  07/05/2022 (Originally 03/10/2019)   OPHTHALMOLOGY EXAM  04/27/2022   HEMOGLOBIN A1C  05/02/2022   PAP SMEAR-Modifier  07/20/2022   Diabetic kidney evaluation - GFR measurement  10/31/2022   Diabetic kidney evaluation - Urine ACR  10/31/2022   FOOT EXAM  10/31/2022   TETANUS/TDAP  07/05/2031   INFLUENZA VACCINE  Completed   HIV Screening  Completed   HPV VACCINES  Aged Out   COVID-19 Vaccine  Discontinued   Hepatitis C Screening  Discontinued      Review of Systems Patient reports no vision/ hearing changes, adenopathy,fever, weight change,  persistant/recurrent hoarseness , swallowing issues, chest pain, palpitations, edema, persistant/recurrent cough, hemoptysis, dyspnea (rest/exertional/paroxysmal nocturnal), gastrointestinal bleeding (melena, rectal bleeding), abdominal pain, significant heartburn, bowel changes, GU symptoms (dysuria, hematuria, incontinence), Gyn symptoms (abnormal  bleeding, pain),  syncope, focal weakness, memory loss, numbness & tingling, skin/hair/nail changes, abnormal bruising or bleeding, anxiety, or depression.     Objective:   Physical Exam General Appearance:    Alert, cooperative, no distress, appears stated age  Head:    Normocephalic, without obvious abnormality, atraumatic  Eyes:    PERRL, conjunctiva/corneas clear, EOM's intact both eyes  Ears:    Normal TM's and external ear canals, both ears  Nose:   Nares normal, septum midline,  mucosa normal, no drainage    or sinus tenderness  Throat:   Lips, mucosa, and tongue normal; teeth and gums normal  Neck:   Supple, symmetrical, trachea midline, no adenopathy;    Thyroid: no enlargement/tenderness/nodules  Back:     Symmetric, no curvature, ROM normal, no CVA tenderness  Lungs:     Clear to auscultation bilaterally, respirations unlabored  Chest Wall:    No tenderness or deformity   Heart:    Regular rate and rhythm, S1 and S2 normal, no murmur, rub   or gallop  Breast Exam:    Deferred to GYN  Abdomen:     Soft, non-tender, bowel sounds active all four quadrants,    no masses, no organomegaly  Genitalia:    Deferred to GYN  Rectal:    Extremities:   Extremities normal, atraumatic, no cyanosis or edema  Pulses:   2+ and symmetric all extremities  Skin:   Skin color, texture, turgor normal, no rashes or lesions  Lymph nodes:   Cervical, supraclavicular, and axillary nodes normal  Neurologic:   CNII-XII intact, normal strength, sensation and reflexes    throughout          Assessment & Plan:

## 2022-01-16 NOTE — Patient Instructions (Signed)
Follow up in 3-4 months to recheck sugar We'll notify you of your lab results and make any changes if needed Continue to work on healthy diet and regular exercise- you're doing great! Call the [place] [phone] and schedule your mammogram We'll notify you of the GI referral Call with any questions or concerns Stay Safe!  Stay Healthy! Happy Fall!!!

## 2022-01-16 NOTE — Assessment & Plan Note (Signed)
Pt's PE WNL w/ exception of BMI.  Due for mammo- pt to schedule.  Due for colonoscopy- referral placed.  Check labs.  Anticipatory guidance provided.

## 2022-01-16 NOTE — Assessment & Plan Note (Signed)
Check labs and replete prn. 

## 2022-01-16 NOTE — Progress Notes (Signed)
Left pt a vm in regards to lab results  

## 2022-01-16 NOTE — Assessment & Plan Note (Signed)
Ongoing issue for pt.  UTD on foot exam, eye exam, microalbumin.  Check labs.  Adjust meds prn

## 2022-02-04 ENCOUNTER — Other Ambulatory Visit: Payer: Self-pay | Admitting: Family Medicine

## 2022-02-04 DIAGNOSIS — Z853 Personal history of malignant neoplasm of breast: Secondary | ICD-10-CM

## 2022-02-20 ENCOUNTER — Encounter: Payer: No Typology Code available for payment source | Admitting: Family Medicine

## 2022-02-27 ENCOUNTER — Other Ambulatory Visit: Payer: Self-pay | Admitting: Family Medicine

## 2022-02-28 ENCOUNTER — Other Ambulatory Visit (HOSPITAL_COMMUNITY): Payer: Self-pay

## 2022-02-28 MED ORDER — LABETALOL HCL 200 MG PO TABS
200.0000 mg | ORAL_TABLET | Freq: Two times a day (BID) | ORAL | 0 refills | Status: DC
  Filled 2022-02-28 – 2022-04-10 (×2): qty 180, 90d supply, fill #0

## 2022-03-18 ENCOUNTER — Other Ambulatory Visit (HOSPITAL_COMMUNITY): Payer: Self-pay

## 2022-03-18 ENCOUNTER — Ambulatory Visit
Admission: RE | Admit: 2022-03-18 | Discharge: 2022-03-18 | Disposition: A | Discharge status: Home / Self Care | Payer: No Typology Code available for payment source | Source: Ambulatory Visit | Attending: Family Medicine | Admitting: Family Medicine

## 2022-03-18 DIAGNOSIS — Z853 Personal history of malignant neoplasm of breast: Secondary | ICD-10-CM

## 2022-03-18 MED ORDER — PAROXETINE MESYLATE 7.5 MG PO CAPS
7.5000 mg | ORAL_CAPSULE | Freq: Every day | ORAL | 3 refills | Status: DC
  Filled 2022-03-18: qty 90, 90d supply, fill #0

## 2022-03-19 ENCOUNTER — Other Ambulatory Visit (HOSPITAL_COMMUNITY): Payer: Self-pay

## 2022-03-20 ENCOUNTER — Other Ambulatory Visit (HOSPITAL_COMMUNITY): Payer: Self-pay

## 2022-03-26 LAB — HM PAP SMEAR: HPV, high-risk: NEGATIVE

## 2022-04-08 ENCOUNTER — Other Ambulatory Visit (HOSPITAL_COMMUNITY): Payer: Self-pay

## 2022-04-10 ENCOUNTER — Other Ambulatory Visit (HOSPITAL_COMMUNITY): Payer: Self-pay

## 2022-04-10 ENCOUNTER — Other Ambulatory Visit: Payer: Self-pay

## 2022-04-10 ENCOUNTER — Other Ambulatory Visit: Payer: Self-pay | Admitting: Family Medicine

## 2022-04-10 MED ORDER — HYDROCHLOROTHIAZIDE 12.5 MG PO CAPS
12.5000 mg | ORAL_CAPSULE | Freq: Every day | ORAL | 0 refills | Status: DC
  Filled 2022-04-10 – 2022-05-22 (×2): qty 90, 90d supply, fill #0

## 2022-04-15 ENCOUNTER — Other Ambulatory Visit (HOSPITAL_COMMUNITY): Payer: Self-pay

## 2022-05-01 DIAGNOSIS — Z0000 Encounter for general adult medical examination without abnormal findings: Secondary | ICD-10-CM | POA: Diagnosis not present

## 2022-05-01 DIAGNOSIS — Z6831 Body mass index (BMI) 31.0-31.9, adult: Secondary | ICD-10-CM | POA: Diagnosis not present

## 2022-05-01 DIAGNOSIS — Z13220 Encounter for screening for lipoid disorders: Secondary | ICD-10-CM | POA: Diagnosis not present

## 2022-05-01 DIAGNOSIS — Z131 Encounter for screening for diabetes mellitus: Secondary | ICD-10-CM | POA: Diagnosis not present

## 2022-05-01 DIAGNOSIS — Z1329 Encounter for screening for other suspected endocrine disorder: Secondary | ICD-10-CM | POA: Diagnosis not present

## 2022-05-01 DIAGNOSIS — Z713 Dietary counseling and surveillance: Secondary | ICD-10-CM | POA: Diagnosis not present

## 2022-05-02 LAB — LAB REPORT - SCANNED
A1c: 7.3
EGFR: 107

## 2022-05-12 ENCOUNTER — Other Ambulatory Visit (HOSPITAL_COMMUNITY): Payer: Self-pay

## 2022-05-12 ENCOUNTER — Ambulatory Visit (AMBULATORY_SURGERY_CENTER): Payer: 59 | Admitting: *Deleted

## 2022-05-12 VITALS — Ht 62.0 in | Wt 165.0 lb

## 2022-05-12 DIAGNOSIS — Z1211 Encounter for screening for malignant neoplasm of colon: Secondary | ICD-10-CM

## 2022-05-12 MED ORDER — NA SULFATE-K SULFATE-MG SULF 17.5-3.13-1.6 GM/177ML PO SOLN
1.0000 | Freq: Once | ORAL | 0 refills | Status: AC
  Filled 2022-05-12: qty 354, 1d supply, fill #0

## 2022-05-12 NOTE — Progress Notes (Signed)

## 2022-05-16 ENCOUNTER — Encounter: Payer: Self-pay | Admitting: Internal Medicine

## 2022-05-16 ENCOUNTER — Other Ambulatory Visit (HOSPITAL_COMMUNITY): Payer: Self-pay

## 2022-05-22 ENCOUNTER — Other Ambulatory Visit (HOSPITAL_COMMUNITY): Payer: Self-pay

## 2022-05-22 ENCOUNTER — Other Ambulatory Visit: Payer: Self-pay

## 2022-05-26 ENCOUNTER — Encounter: Payer: Self-pay | Admitting: Internal Medicine

## 2022-05-26 ENCOUNTER — Ambulatory Visit (AMBULATORY_SURGERY_CENTER): Payer: 59 | Admitting: Internal Medicine

## 2022-05-26 VITALS — BP 112/56 | HR 66 | Temp 98.6°F | Resp 13 | Ht 62.0 in | Wt 165.0 lb

## 2022-05-26 DIAGNOSIS — Z1211 Encounter for screening for malignant neoplasm of colon: Secondary | ICD-10-CM

## 2022-05-26 DIAGNOSIS — I10 Essential (primary) hypertension: Secondary | ICD-10-CM | POA: Diagnosis not present

## 2022-05-26 DIAGNOSIS — D123 Benign neoplasm of transverse colon: Secondary | ICD-10-CM | POA: Diagnosis not present

## 2022-05-26 DIAGNOSIS — J45909 Unspecified asthma, uncomplicated: Secondary | ICD-10-CM | POA: Diagnosis not present

## 2022-05-26 DIAGNOSIS — K635 Polyp of colon: Secondary | ICD-10-CM | POA: Diagnosis not present

## 2022-05-26 MED ORDER — SODIUM CHLORIDE 0.9 % IV SOLN: 500.0000 mL | Freq: Once | INTRAVENOUS | Status: DC

## 2022-05-26 NOTE — Op Note (Signed)
[facility] Patient Name: Amy Torres Procedure Date: 05/26/2022 8:12 AM MRN: [id number] Endoscopist: Georgian Co , , [id number] Age: 47 Referring MD:  Date of Birth: 05/16/1975 Gender: Female Account #: [id number] Procedure:                Colonoscopy Indications:              Screening for colorectal malignant neoplasm, This                            is the patient's first colonoscopy Medicines:                Monitored Anesthesia Care Procedure:                Pre-Anesthesia Assessment:                           - Prior to the procedure, a History and Physical                            was performed, and patient medications and                            allergies were reviewed. The patient's tolerance of                            previous anesthesia was also reviewed. The risks                            and benefits of the procedure and the sedation                            options and risks were discussed with the patient.                            All questions were answered, and informed consent                            was obtained. Prior Anticoagulants: The patient has                            taken no anticoagulant or antiplatelet agents. ASA                            Grade Assessment: III - A patient with severe                            systemic disease. After reviewing the risks and                            benefits, the patient was deemed in satisfactory                            condition to undergo the procedure.  After obtaining informed consent, the colonoscope                            was passed under direct vision. Throughout the                            procedure, the patient's blood pressure, pulse, and                            oxygen saturations were monitored continuously. The                            CF [id number] was introduced through the anus                            and advanced  to the the terminal ileum. The                            colonoscopy was performed without difficulty. The                            patient tolerated the procedure well. The quality                            of the bowel preparation was good. The terminal                            ileum, ileocecal valve, appendiceal orifice, and                            rectum were photographed. Scope In: 8:25:20 AM Scope Out: 8:40:09 AM Scope Withdrawal Time: 0 hours 11 minutes 23 seconds  Total Procedure Duration: 0 hours 14 minutes 49 seconds  Findings:                 The terminal ileum appeared normal.                           A 5 mm polyp was found in the transverse colon. The                            polyp was sessile. The polyp was removed with a                            cold snare. Resection and retrieval were complete.                           Non-bleeding internal hemorrhoids were found during                            retroflexion. Complications:            No immediate complications. Estimated Blood Loss:     Estimated blood loss was minimal. Impression:               -  The examined portion of the ileum was normal.                           - One 5 mm polyp in the transverse colon, removed                            with a cold snare. Resected and retrieved.                           - Non-bleeding internal hemorrhoids. Recommendation:           - Discharge patient to a nursing home (with escort).                           - Await pathology results.                           - The findings and recommendations were discussed                            with the patient. Dr Georgian Co "[place]" [place],  05/26/2022 8:42:24 AM

## 2022-05-26 NOTE — Progress Notes (Signed)
GASTROENTEROLOGY PROCEDURE H&P NOTE   Primary Care Physician: Midge Minium, MD    Reason for Procedure:   Colon cancer screening  Plan:    Colonoscopy  Patient is appropriate for endoscopic procedure(s) in the ambulatory ([place]) setting.  The nature of the procedure, as well as the risks, benefits, and alternatives were carefully and thoroughly reviewed with the patient. Ample time for discussion and questions allowed. The patient understood, was satisfied, and agreed to proceed.     HPI: Amy Torres is a 47 y.o. female who presents for colonoscopy for colon cancer screening. Denies blood in stools, changes in bowel habits, or unintentional weight loss. Denies family history of colon cancer.  Past Medical History:  Diagnosis Date   Allergy    seasonal   Asthma    Diabetes mellitus    borderline gestational   Hx gestational diabetes    Hypertension    Miscarriage    12/11   Pregnancy, complicated [id number]   Previous cesarean delivery, delivered 01/01/2011    Past Surgical History:  Procedure Laterality Date   BREAST CYST EXCISION     left breast   CESAREAN SECTION      Prior to Admission medications   Medication Sig Start Date End Date Taking? Authorizing Provider  albuterol (VENTOLIN HFA) 108 (90 Base) MCG/ACT inhaler Inhale 2 puffs into the lungs every 6 (six) hours as needed for wheezing or shortness of breath. 10/30/21  Yes Midge Minium, MD  Cyanocobalamin (VITAMIN B-12 PO) Take 1,000 mg by mouth daily.   Yes [provider]  fluticasone (FLOVENT HFA) 110 MCG/ACT inhaler Inhale 2 puffs into the lungs in the morning and at bedtime. 10/30/21  Yes Midge Minium, MD  hydrochlorothiazide (MICROZIDE) 12.5 MG capsule Take 1 capsule (12.5 mg total) by mouth daily. 04/10/22  Yes Midge Minium, MD  labetalol (NORMODYNE) 200 MG tablet Take 1 tablet (200 mg total) by mouth 2 (two) times daily. 02/28/22  Yes Midge Minium, MD   VITAMIN D, CHOLECALCIFEROL, PO Take by mouth daily.   Yes [provider]  betamethasone dipropionate 0.05 % cream Apply topically 2 (two) times daily. 10/30/21   Midge Minium, MD  fluticasone [facility]) 50 MCG/ACT nasal spray SPRAY 2 SPRAYS INTO EACH NOSTRIL EVERY DAY 02/22/20   Midge Minium, MD  ibuprofen (ADVIL,MOTRIN) 200 MG tablet Take 400 mg by mouth every 6 (six) hours as needed for moderate pain.    [provider]  loratadine (CLARITIN) 10 MG tablet TAKE 1 TABLET BY MOUTH EVERY DAY 08/19/21   Midge Minium, MD  montelukast (SINGULAIR) 10 MG tablet Take 10 mg by mouth daily. Patient not taking: Reported on 05/12/2022    [provider]  ondansetron (ZOFRAN-ODT) 8 MG disintegrating tablet ondansetron 8 mg disintegrating tablet Patient not taking: Reported on 05/12/2022    [provider]  PARoxetine Mesylate (BRISDELLE) 7.5 MG CAPS Take 1 capsule (7.66m) by mouth daily as directed 03/18/22       Current Outpatient Medications  Medication Sig Dispense Refill   albuterol (VENTOLIN HFA) 108 (90 Base) MCG/ACT inhaler Inhale 2 puffs into the lungs every 6 (six) hours as needed for wheezing or shortness of breath. 6.7 g 3   Cyanocobalamin (VITAMIN B-12 PO) Take 1,000 mg by mouth daily.     fluticasone (FLOVENT HFA) 110 MCG/ACT inhaler Inhale 2 puffs into the lungs in the morning and at bedtime. 12 g 6   hydrochlorothiazide (MICROZIDE) 12.5  MG capsule Take 1 capsule (12.5 mg total) by mouth daily. 90 capsule 0   labetalol (NORMODYNE) 200 MG tablet Take 1 tablet (200 mg total) by mouth 2 (two) times daily. 180 tablet 0   VITAMIN D, CHOLECALCIFEROL, PO Take by mouth daily.     betamethasone dipropionate 0.05 % cream Apply topically 2 (two) times daily. 45 g 1   fluticasone (FLONASE) 50 MCG/ACT nasal spray SPRAY 2 SPRAYS INTO EACH NOSTRIL EVERY DAY 48 mL 2   ibuprofen (ADVIL,MOTRIN) 200 MG tablet Take 400 mg by mouth every 6 (six) hours as needed  for moderate pain.     loratadine (CLARITIN) 10 MG tablet TAKE 1 TABLET BY MOUTH EVERY DAY 90 tablet 1   montelukast (SINGULAIR) 10 MG tablet Take 10 mg by mouth daily. (Patient not taking: Reported on 05/12/2022)     ondansetron (ZOFRAN-ODT) 8 MG disintegrating tablet ondansetron 8 mg disintegrating tablet (Patient not taking: Reported on 05/12/2022)     PARoxetine Mesylate (BRISDELLE) 7.5 MG CAPS Take 1 capsule (7.21m) by mouth daily as directed 90 capsule 3   Current Facility-Administered Medications  Medication Dose Route Frequency Provider Last Rate Last Admin   0.9 %  sodium chloride infusion  500 mL Intravenous Once DSharyn Creamer MD        Allergies as of 05/26/2022   (No Known Allergies)    Family History  Problem Relation Age of Onset   Hypertension Mother    Stroke Mother    Cancer Maternal Aunt        breast cancer   Cancer Paternal Aunt        breast cancer   Breast cancer Paternal Aunt    Colon cancer Neg Hx    Colon polyps Neg Hx    Crohn's disease Neg Hx    Esophageal cancer Neg Hx    Rectal cancer Neg Hx    Stomach cancer Neg Hx    Ulcerative colitis Neg Hx     Social History   Socioeconomic History   Marital status: Married    Spouse name: Not on file   Number of children: Not on file   Years of education: Not on file   Highest education level: Not on file  Occupational History   Not on file  Tobacco Use   Smoking status: Never   Smokeless tobacco: Never  Vaping Use   Vaping Use: Never used  Substance and Sexual Activity   Alcohol use: No   Drug use: No   Sexual activity: Yes  Other Topics Concern   Not on file  Social History Narrative   Not on file   Social Determinants of Health   Financial Resource Strain: Not on file  Food Insecurity: Not on file  Transportation Needs: Not on file  Physical Activity: Not on file  Stress: Not on file  Social Connections: Not on file  Intimate Partner Violence: Not on file    Physical  Exam: Vital signs in last 24 hours: BP 114/68   Pulse 66   Temp 98.6 F (37 C) (Temporal)   Ht 5' 2"$  (1.575 m)   Wt 165 lb (74.8 kg)   LMP 05/07/2022   SpO2 99%   BMI 30.18 kg/m  GEN: NAD EYE: Sclerae anicteric ENT: MMM CV: Non-tachycardic Pulm: No increased work of breathing GI: Soft, NT/ND NEURO:  Alert & Oriented   CChristia Reading MD [facility]  05/26/2022 8:12 AM

## 2022-05-26 NOTE — Patient Instructions (Signed)
Resume previous diet, resume previous medications. Blood sugar was 171 in recovery. Informations given on hemorrhoids and colon polyps.  YOU HAD AN ENDOSCOPIC PROCEDURE TODAY AT [facility]:   Refer to the procedure report that was given to you for any specific questions about what was found during the examination.  If the procedure report does not answer your questions, please call your gastroenterologist to clarify.  If you requested that your care partner not be given the details of your procedure findings, then the procedure report has been included in a sealed envelope for you to review at your convenience later.  YOU SHOULD EXPECT: Some feelings of bloating in the abdomen. Passage of more gas than usual.  Walking can help get rid of the air that was put into your GI tract during the procedure and reduce the bloating. If you had a lower endoscopy (such as a colonoscopy or flexible sigmoidoscopy) you may notice spotting of blood in your stool or on the toilet paper. If you underwent a bowel prep for your procedure, you may not have a normal bowel movement for a few days.  Please Note:  You might notice some irritation and congestion in your nose or some drainage.  This is from the oxygen used during your procedure.  There is no need for concern and it should clear up in a day or so.  SYMPTOMS TO REPORT IMMEDIATELY:  Following lower endoscopy (colonoscopy):  Excessive amounts of blood in the stool  Significant tenderness or worsening of abdominal pains  Swelling of the abdomen that is new, acute  Fever of 100F or higher  For urgent or emergent issues, a gastroenterologist can be reached at any hour by calling [phone]. Do not use MyChart messaging for urgent concerns.    DIET:  We do recommend a small meal at first, but then you may proceed to your regular diet.  Drink plenty of fluids but you should avoid alcoholic beverages for 24 hours.  ACTIVITY:  You should  plan to take it easy for the rest of today and you should NOT DRIVE or use heavy machinery until tomorrow (because of the sedation medicines used during the test).    FOLLOW UP: Our staff will call the number listed on your records the next business day following your procedure.  We will call around 7:15- 8:00 am to check on you and address any questions or concerns that you may have regarding the information given to you following your procedure. If we do not reach you, we will leave a message.     If any biopsies were taken you will be contacted by phone or by letter within the next 1-3 weeks.  Please call us at [phone] if you have not heard about the biopsies in 3 weeks.    SIGNATURES/CONFIDENTIALITY: You and/or your care partner have signed paperwork which will be entered into your electronic medical record.  These signatures attest to the fact that that the information above on your After Visit Summary has been reviewed and is understood.  Full responsibility of the confidentiality of this discharge information lies with you and/or your care-partner.

## 2022-05-26 NOTE — Progress Notes (Signed)
Vss nad trans to pacu °

## 2022-05-26 NOTE — Progress Notes (Signed)
Called to room to assist during endoscopic procedure.  Patient ID and intended procedure confirmed with present staff. Received instructions for my participation in the procedure from the performing physician.  

## 2022-05-26 NOTE — Progress Notes (Signed)
VS completed by CW.   Pt's states no medical or surgical changes since previsit or office visit.  

## 2022-05-27 ENCOUNTER — Telehealth: Payer: Self-pay | Admitting: *Deleted

## 2022-05-27 NOTE — Telephone Encounter (Signed)
  Follow up Call-     05/26/2022    7:44 AM  Call back number  Post procedure Call Back phone  # [phone]  Permission to leave phone message Yes     Patient questions:  Do you have a fever, pain , or abdominal swelling? No. Pain Score  0 *  Have you tolerated food without any problems? Yes.    Have you been able to return to your normal activities? Yes.    Do you have any questions about your discharge instructions: Diet   No. Medications  No. Follow up visit  No.  Do you have questions or concerns about your Care? No.  Actions: * If pain score is 4 or above: No action needed, pain <4.

## 2022-05-28 ENCOUNTER — Encounter: Payer: Self-pay | Admitting: Internal Medicine

## 2022-06-02 ENCOUNTER — Ambulatory Visit: Payer: No Typology Code available for payment source | Admitting: Family Medicine

## 2022-06-18 ENCOUNTER — Other Ambulatory Visit (HOSPITAL_COMMUNITY): Payer: Self-pay

## 2022-06-18 DIAGNOSIS — I10 Essential (primary) hypertension: Secondary | ICD-10-CM | POA: Diagnosis not present

## 2022-06-18 DIAGNOSIS — E1165 Type 2 diabetes mellitus with hyperglycemia: Secondary | ICD-10-CM | POA: Diagnosis not present

## 2022-06-18 MED ORDER — OZEMPIC (0.25 OR 0.5 MG/DOSE) 2 MG/3ML ~~LOC~~ SOPN
0.5000 mg | PEN_INJECTOR | SUBCUTANEOUS | 3 refills | Status: DC
  Filled 2022-06-18 – 2022-11-07 (×2): qty 3, 28d supply, fill #0
  Filled 2022-12-02: qty 3, 28d supply, fill #1
  Filled 2022-12-30: qty 3, 28d supply, fill #2
  Filled 2023-01-25: qty 3, 28d supply, fill #3

## 2022-06-20 ENCOUNTER — Other Ambulatory Visit: Payer: Self-pay | Admitting: Family Medicine

## 2022-06-23 ENCOUNTER — Other Ambulatory Visit (HOSPITAL_COMMUNITY): Payer: Self-pay

## 2022-06-23 MED ORDER — LABETALOL HCL 200 MG PO TABS
200.0000 mg | ORAL_TABLET | Freq: Two times a day (BID) | ORAL | 0 refills | Status: DC
  Filled 2022-06-23: qty 180, 90d supply, fill #0

## 2022-07-04 ENCOUNTER — Other Ambulatory Visit (HOSPITAL_COMMUNITY): Payer: Self-pay

## 2022-08-18 ENCOUNTER — Other Ambulatory Visit (HOSPITAL_COMMUNITY): Payer: Self-pay

## 2022-08-18 MED ORDER — OZEMPIC (0.25 OR 0.5 MG/DOSE) 2 MG/3ML ~~LOC~~ SOPN
0.5000 mg | PEN_INJECTOR | SUBCUTANEOUS | 3 refills | Status: DC
  Filled 2022-08-18: qty 3, 28d supply, fill #0
  Filled 2022-09-17: qty 3, 28d supply, fill #1
  Filled 2022-09-29 – 2022-10-08 (×2): qty 3, 28d supply, fill #2
  Filled 2023-03-15 – 2023-05-12 (×2): qty 3, 28d supply, fill #3

## 2022-08-30 ENCOUNTER — Other Ambulatory Visit: Payer: Self-pay | Admitting: Family Medicine

## 2022-09-01 ENCOUNTER — Other Ambulatory Visit (HOSPITAL_COMMUNITY): Payer: Self-pay

## 2022-09-01 MED ORDER — HYDROCHLOROTHIAZIDE 12.5 MG PO CAPS
12.5000 mg | ORAL_CAPSULE | Freq: Every day | ORAL | 0 refills | Status: DC
  Filled 2022-09-01: qty 90, 90d supply, fill #0

## 2022-09-22 ENCOUNTER — Other Ambulatory Visit: Payer: Self-pay | Admitting: Family Medicine

## 2022-09-23 ENCOUNTER — Other Ambulatory Visit (HOSPITAL_COMMUNITY): Payer: Self-pay

## 2022-09-23 MED ORDER — LABETALOL HCL 200 MG PO TABS
200.0000 mg | ORAL_TABLET | Freq: Two times a day (BID) | ORAL | 0 refills | Status: DC
  Filled 2022-09-23: qty 47, 24d supply, fill #0
  Filled 2022-09-23: qty 133, 66d supply, fill #0

## 2022-09-23 NOTE — Telephone Encounter (Signed)
Patient is requesting a refill of the following medications: Requested Prescriptions   Pending Prescriptions Disp Refills   labetalol (NORMODYNE) 200 MG tablet 180 tablet 0    Sig: Take 1 tablet (200 mg total) by mouth 2 (two) times daily.    Date of patient request: 09/22/22 Last office visit: 01/16/22 Date of last refill: 06/23/22 Last refill amount: 180 Follow up time period per chart: 3-4 mon

## 2022-09-23 NOTE — Telephone Encounter (Signed)
Appointment made for 10/01/22

## 2022-09-24 DIAGNOSIS — I10 Essential (primary) hypertension: Secondary | ICD-10-CM | POA: Diagnosis not present

## 2022-09-24 DIAGNOSIS — E1165 Type 2 diabetes mellitus with hyperglycemia: Secondary | ICD-10-CM | POA: Diagnosis not present

## 2022-09-24 LAB — MICROALBUMIN, URINE: Microalb, Ur: 6

## 2022-09-24 LAB — HEMOGLOBIN A1C: Hemoglobin A1C: 6.1

## 2022-09-26 IMAGING — MG DIGITAL DIAGNOSTIC BILAT W/ TOMO W/ CAD
8 series · 8 of 24 positions shown · non-contrast
Comparison: Previous exam(s).

CLINICAL DATA: Patient presents for a six-month follow-up
diagnostic right breast exam to evaluate 2 probable benign masses at
the 11 o'clock and [DATE] positions. Patient is due for her annual
bilateral mammogram.

EXAM:
DIGITAL DIAGNOSTIC BILATERAL MAMMOGRAM WITH TOMOSYNTHESIS AND CAD;
ULTRASOUND RIGHT BREAST LIMITED
TECHNIQUE: Bilateral digital diagnostic mammography and breast tomosynthesis
was performed. The images were evaluated with computer-aided
detection.; Targeted ultrasound examination of the right breast was
performed

[R CC synth-2D]
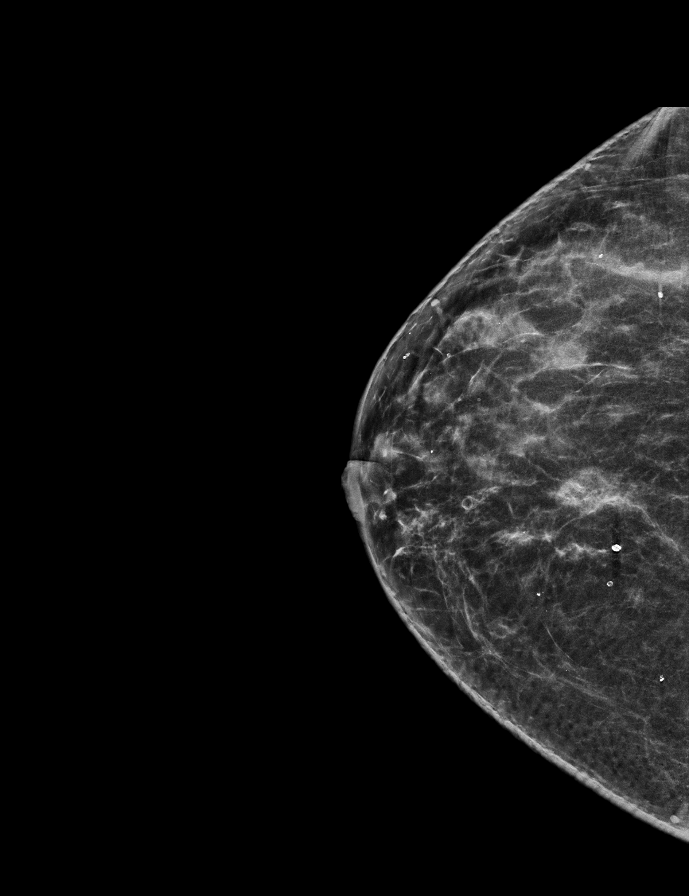

[L CC synth-2D]
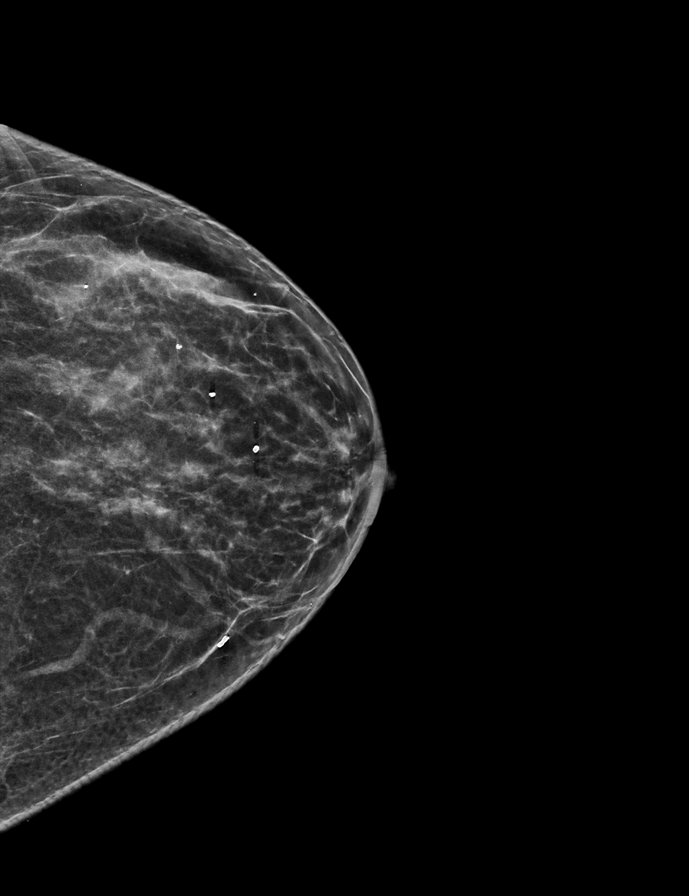

[R MLO synth-2D]
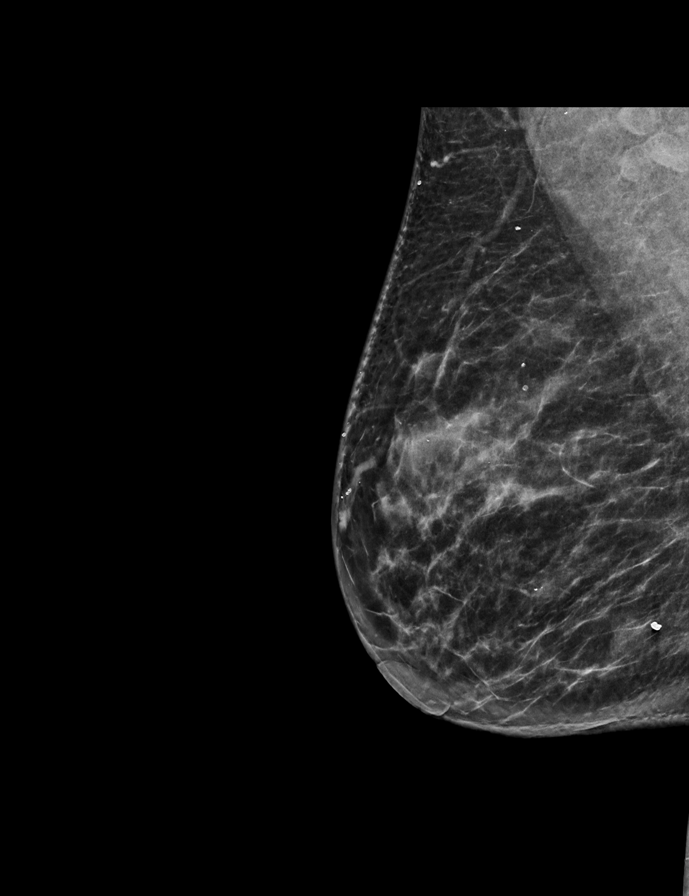

[L MLO synth-2D]
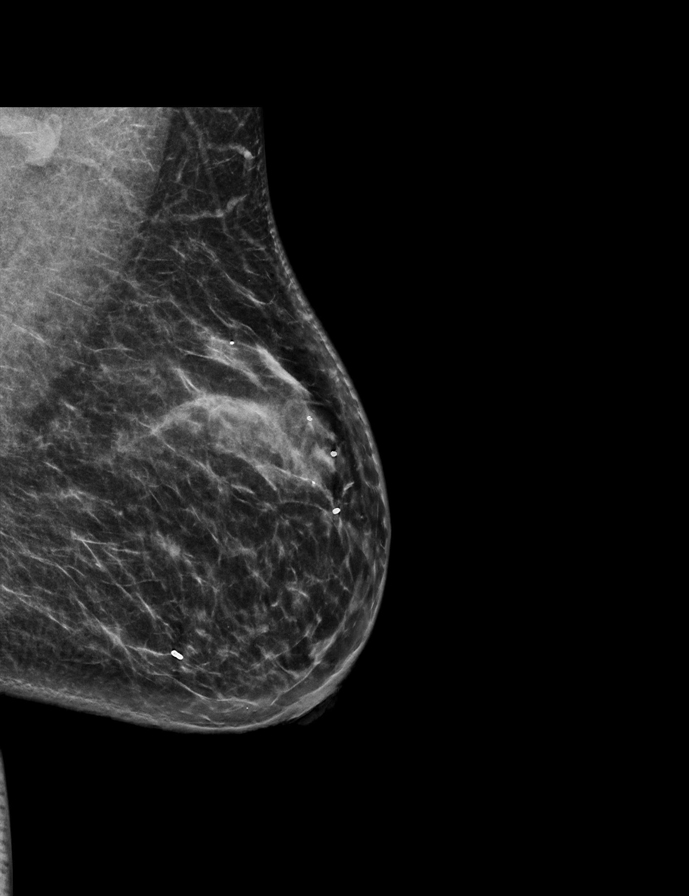

[R MLO tomo · tomo slice 33/66.0]
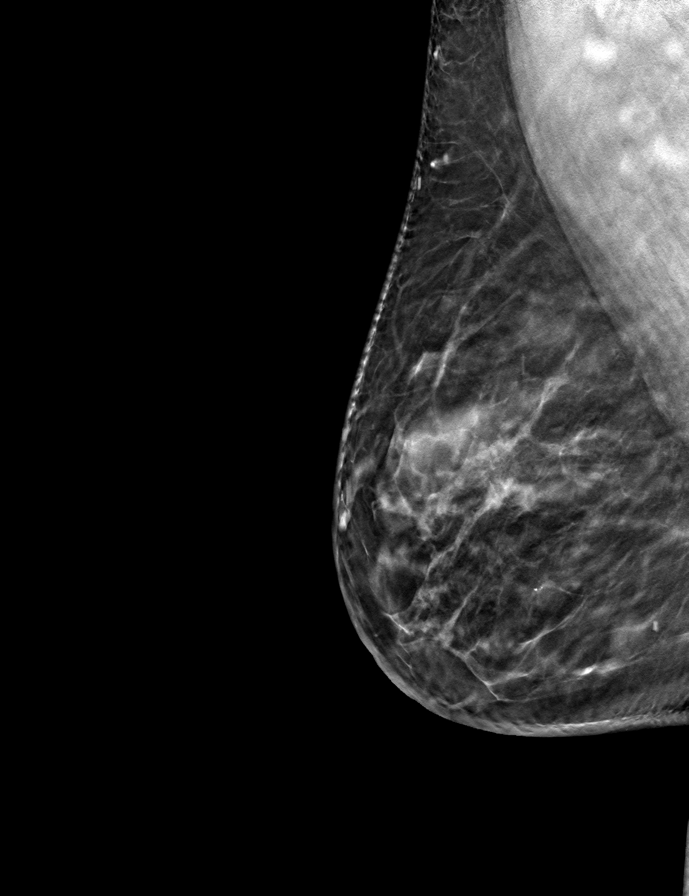

[R CC tomo · tomo slice 28/55.0]
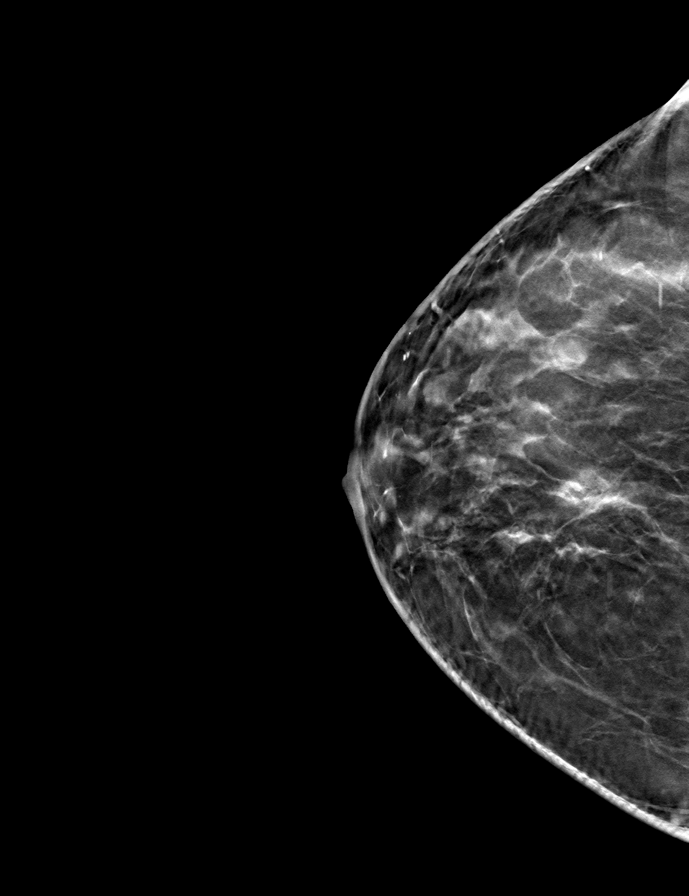

[L MLO tomo · tomo slice 35/68.0]
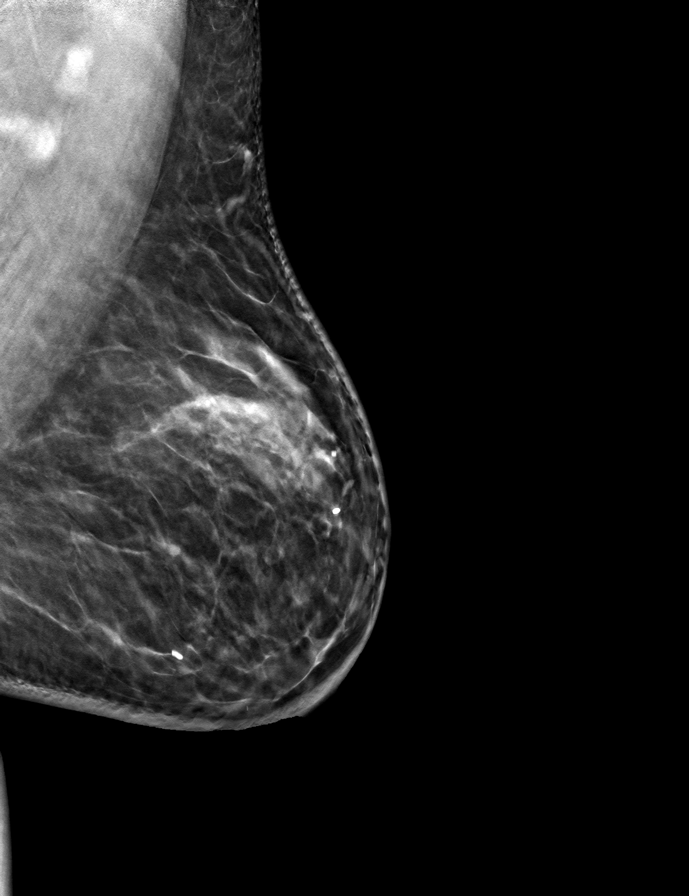

[L CC tomo · tomo slice 27/52.0]
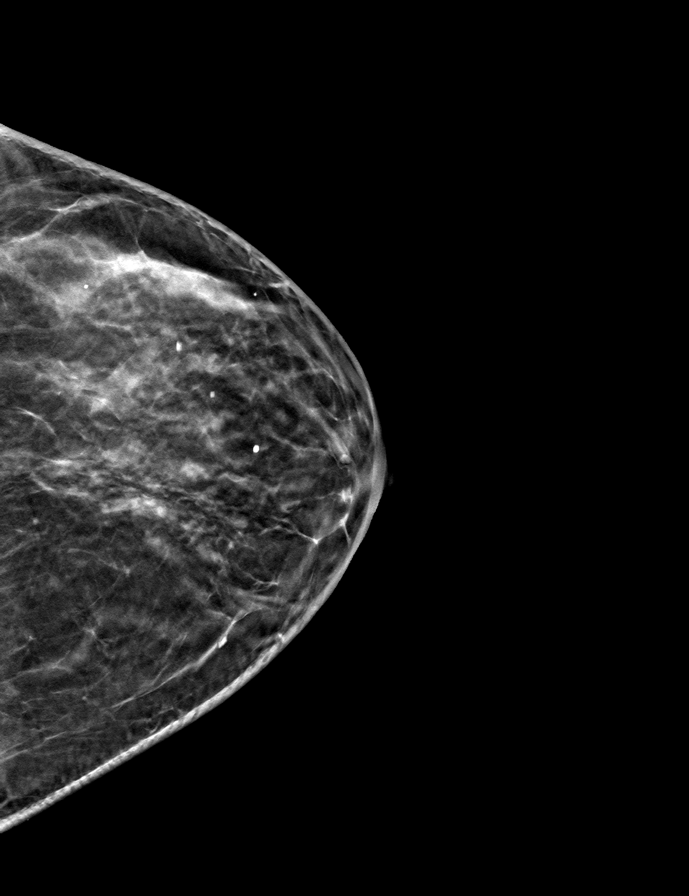

[8 of 24 positions shown; findings below may reference images not displayed]

ACR Breast Density Category c: The breast tissue is heterogeneously
dense, which may obscure small masses.
FINDINGS: Exam demonstrates no change in an asymmetry over the middle to
posterior third of the central right breast on the CC image.
Remainder of the right breast is unchanged. Left breast is
unchanged.

Targeted ultrasound is performed, showing no significant change in
oval circumscribed hypoechoic mass at the 11 o'clock position of the
right breast 2 cm from the nipple measuring 3 x 8 x 10 mm.
Ultrasound over the [DATE] position of the right breast 5 cm from the
nipple demonstrates no change in an oval/lobulated hypoechoic mass
measuring 3 x 6 x 8 mm.
IMPRESSION: Two stable probable benign masses over the 11 o'clock and [DATE]
positions of the right breast as described. Stable left breast
mammogram.

RECOMMENDATION:
Recommend an additional follow-up diagnostic right breast mammogram
and ultrasound in 1 year at the time patient's annual bilateral
mammogram in July 2021 to document 2 years of stability.

I have discussed the findings and recommendations with the patient.
If applicable, a reminder letter will be sent to the patient
regarding the next appointment.

BI-RADS CATEGORY  3: Probably benign.

## 2022-09-29 ENCOUNTER — Other Ambulatory Visit: Payer: Self-pay

## 2022-10-01 ENCOUNTER — Encounter: Payer: Self-pay | Admitting: Family Medicine

## 2022-10-01 ENCOUNTER — Ambulatory Visit (INDEPENDENT_AMBULATORY_CARE_PROVIDER_SITE_OTHER): Payer: 59 | Admitting: Family Medicine

## 2022-10-01 ENCOUNTER — Other Ambulatory Visit (HOSPITAL_COMMUNITY): Payer: Self-pay

## 2022-10-01 VITALS — BP 124/72 | HR 65 | Temp 98.5°F | Resp 17 | Ht 62.0 in | Wt 162.2 lb

## 2022-10-01 DIAGNOSIS — I10 Essential (primary) hypertension: Secondary | ICD-10-CM

## 2022-10-01 DIAGNOSIS — E663 Overweight: Secondary | ICD-10-CM | POA: Diagnosis not present

## 2022-10-01 DIAGNOSIS — E781 Pure hyperglyceridemia: Secondary | ICD-10-CM

## 2022-10-01 DIAGNOSIS — Z7985 Long-term (current) use of injectable non-insulin antidiabetic drugs: Secondary | ICD-10-CM | POA: Diagnosis not present

## 2022-10-01 DIAGNOSIS — E119 Type 2 diabetes mellitus without complications: Secondary | ICD-10-CM | POA: Diagnosis not present

## 2022-10-01 LAB — CBC WITH DIFFERENTIAL/PLATELET
Basophils Absolute: 0 10*3/uL (ref 0.0–0.1)
Basophils Relative: 0.3 % (ref 0.0–3.0)
Eosinophils Absolute: 0.1 10*3/uL (ref 0.0–0.7)
Eosinophils Relative: 1.3 % (ref 0.0–5.0)
HCT: 42.9 % (ref 36.0–46.0)
Hemoglobin: 14.3 g/dL (ref 12.0–15.0)
Lymphocytes Relative: 35.7 % (ref 12.0–46.0)
Lymphs Abs: 2.3 10*3/uL (ref 0.7–4.0)
MCHC: 33.4 g/dL (ref 30.0–36.0)
MCV: 88.9 fl (ref 78.0–100.0)
Monocytes Absolute: 0.5 10*3/uL (ref 0.1–1.0)
Monocytes Relative: 7.1 % (ref 3.0–12.0)
Neutro Abs: 3.6 10*3/uL (ref 1.4–7.7)
Neutrophils Relative %: 55.6 % (ref 43.0–77.0)
Platelets: 289 10*3/uL (ref 150.0–400.0)
RBC: 4.83 Mil/uL (ref 3.87–5.11)
RDW: 12.6 % (ref 11.5–15.5)
WBC: 6.5 10*3/uL (ref 4.0–10.5)

## 2022-10-01 LAB — LIPID PANEL
Cholesterol: 209 mg/dL — ABNORMAL HIGH (ref 0–200)
HDL: 49.1 mg/dL (ref >39.00)
NonHDL: 159.63
Total CHOL/HDL Ratio: 4
Triglycerides: 210 mg/dL — ABNORMAL HIGH (ref 0.0–149.0)
VLDL: 42 mg/dL — ABNORMAL HIGH (ref 0.0–40.0)

## 2022-10-01 LAB — TSH: TSH: 1.59 u[IU]/mL (ref 0.35–5.50)

## 2022-10-01 LAB — LDL CHOLESTEROL, DIRECT: Direct LDL: 137 mg/dL

## 2022-10-01 MED ORDER — LABETALOL HCL 200 MG PO TABS
200.0000 mg | ORAL_TABLET | Freq: Two times a day (BID) | ORAL | 0 refills | Status: DC
  Filled 2022-10-08: qty 180, 90d supply, fill #0

## 2022-10-01 NOTE — Progress Notes (Signed)
   Subjective:    Patient ID: Amy Torres, female    DOB: 06/02/1975, 47 y.o.   MRN: [id number]  HPI DM- chronic problem, now seeing Dr Talmage Nap.  UTD on foot exam (but coming due).  UTD on microalbumin (but coming due).  Eye exam scheduled.  Currently on Ozempic 0.5mg  weekly.  No abd pain, N/V.  A1C 6.1%, microalbumin done on 6/12  HTN- chronic problem, on hydrochlorothiazide 12.5mg  daily an Labetalol 200mg  BID w/ good control.  No CP, SOB, HA's, visual changes, edema.  Hypertriglyceridemia- last trigs were 233.  Was attempting to control w/ diet and exercise.  Overweight- weight is stable.  BMI 29.68   Review of Systems For ROS see HPI     Objective:   Physical Exam Vitals reviewed.  Constitutional:      General: She is not in acute distress.    Appearance: Normal appearance. She is well-developed. She is not ill-appearing.  HENT:     Head: Normocephalic and atraumatic.  Eyes:     Conjunctiva/sclera: Conjunctivae normal.     Pupils: Pupils are equal, round, and reactive to light.  Neck:     Thyroid: No thyromegaly.  Cardiovascular:     Rate and Rhythm: Normal rate and regular rhythm.     Heart sounds: Normal heart sounds. No murmur heard. Pulmonary:     Effort: Pulmonary effort is normal. No respiratory distress.     Breath sounds: Normal breath sounds.  Abdominal:     General: There is no distension.     Palpations: Abdomen is soft.     Tenderness: There is no abdominal tenderness.  Musculoskeletal:     Cervical back: Normal range of motion and neck supple.  Lymphadenopathy:     Cervical: No cervical adenopathy.  Skin:    General: Skin is warm and dry.  Neurological:     Mental Status: She is alert and oriented to person, place, and time.  Psychiatric:        Behavior: Behavior normal.           Assessment & Plan:

## 2022-10-01 NOTE — Assessment & Plan Note (Signed)
Chronic problem.  On hydrochlorothiazide 12.5mg  daily and labetalol 200mg  BID w/ excellent control today.  Currently asymptomatic.  Reviewed recent CMP done on 6/12 at [facility]- no changes at this time.

## 2022-10-01 NOTE — Assessment & Plan Note (Signed)
Attempting to control w/ diet and exercise.  Check labs and start meds prn. 

## 2022-10-01 NOTE — Assessment & Plan Note (Signed)
Chronic problem.  Currently following w/ [facility].  A1C last week 6.1%  UTD on microalbumin.  Eye exam scheduled.  Foot exam done today.  No need to repeat labs.  Will continue to follow along.

## 2022-10-01 NOTE — Assessment & Plan Note (Signed)
Weight and BMI are stable.  Encouraged low carb diet and regular exercise.  Will follow.

## 2022-10-01 NOTE — Patient Instructions (Signed)
Schedule your complete physical in 6 months We'll notify you of your lab results and make any changes if needed Keep up the good work on healthy diet and regular exercise- you look great!! Call with any questions or concerns Stay Safe!  Stay Healthy! Have a great summer!!! 

## 2022-10-02 ENCOUNTER — Telehealth: Payer: Self-pay

## 2022-10-02 ENCOUNTER — Other Ambulatory Visit: Payer: Self-pay

## 2022-10-02 ENCOUNTER — Other Ambulatory Visit (HOSPITAL_COMMUNITY): Payer: Self-pay

## 2022-10-02 DIAGNOSIS — E785 Hyperlipidemia, unspecified: Secondary | ICD-10-CM

## 2022-10-02 MED ORDER — ATORVASTATIN CALCIUM 20 MG PO TABS
20.0000 mg | ORAL_TABLET | Freq: Every day | ORAL | 3 refills | Status: DC
Start: 2022-10-02 — End: 2023-03-08
  Filled 2022-10-02: qty 30, 30d supply, fill #0
  Filled 2022-11-28: qty 30, 30d supply, fill #1
  Filled 2022-12-30: qty 30, 30d supply, fill #2
  Filled 2023-02-06: qty 30, 30d supply, fill #3

## 2022-10-02 NOTE — Telephone Encounter (Signed)
-----   Message from Sheliah Hatch, MD sent at 10/01/2022  5:23 PM EDT ----- Total cholesterol and LDL (bad cholesterol) are both elevated when compared to last check and well above goal for someone with diabetes (LDL goal is <70).  Based on this, we need to start Atorvastatin 20mg  nightly while working on healthy diet and regular exercise.  We will repeat your liver functions at a lab only visit in 6-8 weeks to ensure the medication is being metabolized appropriately (dx hyperlipidemia)  Remainder of labs look good!

## 2022-10-02 NOTE — Telephone Encounter (Signed)
Pt aware of lab results . Atorvastatin  20 mg to the pharmacy pt was agreeable . Lab order to repeat liver function is in she will go the lab on [address] to have drawn in 6 weeks

## 2022-10-08 ENCOUNTER — Other Ambulatory Visit (HOSPITAL_COMMUNITY): Payer: Self-pay

## 2022-10-09 ENCOUNTER — Other Ambulatory Visit (HOSPITAL_COMMUNITY): Payer: Self-pay

## 2022-10-10 ENCOUNTER — Other Ambulatory Visit (HOSPITAL_COMMUNITY): Payer: Self-pay

## 2022-10-14 ENCOUNTER — Encounter: Payer: Self-pay | Admitting: Family Medicine

## 2022-10-14 ENCOUNTER — Other Ambulatory Visit (HOSPITAL_COMMUNITY): Payer: Self-pay

## 2022-11-07 ENCOUNTER — Other Ambulatory Visit (HOSPITAL_COMMUNITY): Payer: Self-pay

## 2022-11-18 DIAGNOSIS — H524 Presbyopia: Secondary | ICD-10-CM | POA: Diagnosis not present

## 2022-11-18 LAB — HM DIABETES EYE EXAM

## 2022-11-25 ENCOUNTER — Other Ambulatory Visit: Payer: Self-pay | Admitting: Family Medicine

## 2022-11-26 ENCOUNTER — Other Ambulatory Visit (HOSPITAL_COMMUNITY): Payer: Self-pay

## 2022-11-26 MED ORDER — ALBUTEROL SULFATE HFA 108 (90 BASE) MCG/ACT IN AERS
2.0000 | INHALATION_SPRAY | Freq: Four times a day (QID) | RESPIRATORY_TRACT | 3 refills | Status: AC | PRN
  Filled 2022-11-26: qty 6.7, 25d supply, fill #0
  Filled 2023-02-08: qty 6.7, 25d supply, fill #1

## 2022-11-28 ENCOUNTER — Other Ambulatory Visit: Payer: Self-pay | Admitting: Family Medicine

## 2022-11-28 ENCOUNTER — Other Ambulatory Visit (HOSPITAL_COMMUNITY): Payer: Self-pay

## 2022-11-28 MED ORDER — HYDROCHLOROTHIAZIDE 12.5 MG PO CAPS
12.5000 mg | ORAL_CAPSULE | Freq: Every day | ORAL | 0 refills | Status: DC
  Filled 2022-11-28: qty 90, 90d supply, fill #0

## 2022-12-03 ENCOUNTER — Other Ambulatory Visit: Payer: Self-pay | Admitting: Oncology

## 2022-12-03 DIAGNOSIS — Z006 Encounter for examination for normal comparison and control in clinical research program: Secondary | ICD-10-CM

## 2022-12-04 ENCOUNTER — Other Ambulatory Visit (HOSPITAL_COMMUNITY): Payer: Self-pay

## 2022-12-09 ENCOUNTER — Other Ambulatory Visit (HOSPITAL_COMMUNITY)
Admission: RE | Admit: 2022-12-09 | Discharge: 2022-12-09 | Disposition: A | Discharge status: Home / Self Care | Payer: 59 | Source: Other Acute Inpatient Hospital

## 2022-12-09 DIAGNOSIS — Z006 Encounter for examination for normal comparison and control in clinical research program: Secondary | ICD-10-CM

## 2023-01-04 ENCOUNTER — Other Ambulatory Visit: Payer: Self-pay | Admitting: Family Medicine

## 2023-01-05 ENCOUNTER — Other Ambulatory Visit (HOSPITAL_COMMUNITY): Payer: Self-pay

## 2023-01-05 MED ORDER — LABETALOL HCL 200 MG PO TABS
200.0000 mg | ORAL_TABLET | Freq: Two times a day (BID) | ORAL | 0 refills | Status: DC
  Filled 2023-01-05: qty 180, 90d supply, fill #0

## 2023-01-14 LAB — HELIX MOLECULAR SCREEN: Genetic Analysis Overall Interpretation: NEGATIVE

## 2023-01-19 ENCOUNTER — Other Ambulatory Visit (HOSPITAL_COMMUNITY): Payer: Self-pay

## 2023-01-30 DIAGNOSIS — E1165 Type 2 diabetes mellitus with hyperglycemia: Secondary | ICD-10-CM | POA: Diagnosis not present

## 2023-02-06 ENCOUNTER — Other Ambulatory Visit (HOSPITAL_COMMUNITY): Payer: Self-pay

## 2023-02-06 DIAGNOSIS — E1165 Type 2 diabetes mellitus with hyperglycemia: Secondary | ICD-10-CM | POA: Diagnosis not present

## 2023-02-06 DIAGNOSIS — I10 Essential (primary) hypertension: Secondary | ICD-10-CM | POA: Diagnosis not present

## 2023-02-06 MED ORDER — OZEMPIC (1 MG/DOSE) 4 MG/3ML ~~LOC~~ SOPN
1.0000 mg | PEN_INJECTOR | SUBCUTANEOUS | 5 refills | Status: DC
  Filled 2023-02-06 – 2023-02-17 (×4): qty 3, 28d supply, fill #0
  Filled 2023-03-15: qty 3, 28d supply, fill #1
  Filled 2023-04-07: qty 3, 28d supply, fill #2
  Filled 2023-05-08: qty 3, 28d supply, fill #3
  Filled 2023-06-05: qty 3, 28d supply, fill #4

## 2023-02-08 ENCOUNTER — Other Ambulatory Visit: Payer: Self-pay | Admitting: Family Medicine

## 2023-02-09 ENCOUNTER — Other Ambulatory Visit (HOSPITAL_COMMUNITY): Payer: Self-pay

## 2023-02-09 ENCOUNTER — Other Ambulatory Visit: Payer: Self-pay

## 2023-02-09 MED ORDER — FLUTICASONE PROPIONATE HFA 110 MCG/ACT IN AERO
2.0000 | INHALATION_SPRAY | Freq: Two times a day (BID) | RESPIRATORY_TRACT | 6 refills | Status: DC
  Filled 2023-02-09: qty 12, 30d supply, fill #0
  Filled 2023-05-12: qty 12, 30d supply, fill #1
  Filled 2023-07-16: qty 12, 30d supply, fill #2
  Filled 2023-09-16: qty 12, 30d supply, fill #3
  Filled 2023-12-08: qty 12, 30d supply, fill #4

## 2023-02-10 DIAGNOSIS — M6240 Contracture of muscle, unspecified site: Secondary | ICD-10-CM | POA: Diagnosis not present

## 2023-02-10 DIAGNOSIS — M5134 Other intervertebral disc degeneration, thoracic region: Secondary | ICD-10-CM | POA: Diagnosis not present

## 2023-02-10 DIAGNOSIS — M47814 Spondylosis without myelopathy or radiculopathy, thoracic region: Secondary | ICD-10-CM | POA: Diagnosis not present

## 2023-02-10 DIAGNOSIS — M9901 Segmental and somatic dysfunction of cervical region: Secondary | ICD-10-CM | POA: Diagnosis not present

## 2023-02-10 DIAGNOSIS — M47812 Spondylosis without myelopathy or radiculopathy, cervical region: Secondary | ICD-10-CM | POA: Diagnosis not present

## 2023-02-10 DIAGNOSIS — M9902 Segmental and somatic dysfunction of thoracic region: Secondary | ICD-10-CM | POA: Diagnosis not present

## 2023-02-13 DIAGNOSIS — M9901 Segmental and somatic dysfunction of cervical region: Secondary | ICD-10-CM | POA: Diagnosis not present

## 2023-02-13 DIAGNOSIS — M47814 Spondylosis without myelopathy or radiculopathy, thoracic region: Secondary | ICD-10-CM | POA: Diagnosis not present

## 2023-02-13 DIAGNOSIS — M6240 Contracture of muscle, unspecified site: Secondary | ICD-10-CM | POA: Diagnosis not present

## 2023-02-13 DIAGNOSIS — M47812 Spondylosis without myelopathy or radiculopathy, cervical region: Secondary | ICD-10-CM | POA: Diagnosis not present

## 2023-02-13 DIAGNOSIS — M9902 Segmental and somatic dysfunction of thoracic region: Secondary | ICD-10-CM | POA: Diagnosis not present

## 2023-02-13 DIAGNOSIS — M5134 Other intervertebral disc degeneration, thoracic region: Secondary | ICD-10-CM | POA: Diagnosis not present

## 2023-02-15 ENCOUNTER — Other Ambulatory Visit (HOSPITAL_COMMUNITY): Payer: Self-pay

## 2023-02-17 ENCOUNTER — Other Ambulatory Visit (HOSPITAL_COMMUNITY): Payer: Self-pay

## 2023-02-17 DIAGNOSIS — M47814 Spondylosis without myelopathy or radiculopathy, thoracic region: Secondary | ICD-10-CM | POA: Diagnosis not present

## 2023-02-17 DIAGNOSIS — M47812 Spondylosis without myelopathy or radiculopathy, cervical region: Secondary | ICD-10-CM | POA: Diagnosis not present

## 2023-02-17 DIAGNOSIS — M6240 Contracture of muscle, unspecified site: Secondary | ICD-10-CM | POA: Diagnosis not present

## 2023-02-17 DIAGNOSIS — M9901 Segmental and somatic dysfunction of cervical region: Secondary | ICD-10-CM | POA: Diagnosis not present

## 2023-02-17 DIAGNOSIS — M9902 Segmental and somatic dysfunction of thoracic region: Secondary | ICD-10-CM | POA: Diagnosis not present

## 2023-02-17 DIAGNOSIS — M5134 Other intervertebral disc degeneration, thoracic region: Secondary | ICD-10-CM | POA: Diagnosis not present

## 2023-02-24 DIAGNOSIS — M5134 Other intervertebral disc degeneration, thoracic region: Secondary | ICD-10-CM | POA: Diagnosis not present

## 2023-02-24 DIAGNOSIS — M9901 Segmental and somatic dysfunction of cervical region: Secondary | ICD-10-CM | POA: Diagnosis not present

## 2023-02-24 DIAGNOSIS — M47812 Spondylosis without myelopathy or radiculopathy, cervical region: Secondary | ICD-10-CM | POA: Diagnosis not present

## 2023-02-24 DIAGNOSIS — M6240 Contracture of muscle, unspecified site: Secondary | ICD-10-CM | POA: Diagnosis not present

## 2023-02-24 DIAGNOSIS — M47814 Spondylosis without myelopathy or radiculopathy, thoracic region: Secondary | ICD-10-CM | POA: Diagnosis not present

## 2023-02-24 DIAGNOSIS — M9902 Segmental and somatic dysfunction of thoracic region: Secondary | ICD-10-CM | POA: Diagnosis not present

## 2023-03-04 DIAGNOSIS — M5134 Other intervertebral disc degeneration, thoracic region: Secondary | ICD-10-CM | POA: Diagnosis not present

## 2023-03-04 DIAGNOSIS — M47814 Spondylosis without myelopathy or radiculopathy, thoracic region: Secondary | ICD-10-CM | POA: Diagnosis not present

## 2023-03-04 DIAGNOSIS — M9902 Segmental and somatic dysfunction of thoracic region: Secondary | ICD-10-CM | POA: Diagnosis not present

## 2023-03-04 DIAGNOSIS — M6240 Contracture of muscle, unspecified site: Secondary | ICD-10-CM | POA: Diagnosis not present

## 2023-03-04 DIAGNOSIS — M47812 Spondylosis without myelopathy or radiculopathy, cervical region: Secondary | ICD-10-CM | POA: Diagnosis not present

## 2023-03-04 DIAGNOSIS — M9901 Segmental and somatic dysfunction of cervical region: Secondary | ICD-10-CM | POA: Diagnosis not present

## 2023-03-08 ENCOUNTER — Other Ambulatory Visit: Payer: Self-pay | Admitting: Family Medicine

## 2023-03-08 DIAGNOSIS — E785 Hyperlipidemia, unspecified: Secondary | ICD-10-CM

## 2023-03-09 ENCOUNTER — Other Ambulatory Visit (HOSPITAL_COMMUNITY): Payer: Self-pay

## 2023-03-09 DIAGNOSIS — M47814 Spondylosis without myelopathy or radiculopathy, thoracic region: Secondary | ICD-10-CM | POA: Diagnosis not present

## 2023-03-09 DIAGNOSIS — M6240 Contracture of muscle, unspecified site: Secondary | ICD-10-CM | POA: Diagnosis not present

## 2023-03-09 DIAGNOSIS — M9901 Segmental and somatic dysfunction of cervical region: Secondary | ICD-10-CM | POA: Diagnosis not present

## 2023-03-09 DIAGNOSIS — M9902 Segmental and somatic dysfunction of thoracic region: Secondary | ICD-10-CM | POA: Diagnosis not present

## 2023-03-09 DIAGNOSIS — M47812 Spondylosis without myelopathy or radiculopathy, cervical region: Secondary | ICD-10-CM | POA: Diagnosis not present

## 2023-03-09 DIAGNOSIS — M5134 Other intervertebral disc degeneration, thoracic region: Secondary | ICD-10-CM | POA: Diagnosis not present

## 2023-03-09 MED ORDER — ATORVASTATIN CALCIUM 20 MG PO TABS
20.0000 mg | ORAL_TABLET | Freq: Every day | ORAL | 3 refills | Status: DC
  Filled 2023-03-09: qty 30, 30d supply, fill #0
  Filled 2023-04-17: qty 30, 30d supply, fill #1
  Filled 2023-05-12: qty 30, 30d supply, fill #2
  Filled 2023-06-21: qty 30, 30d supply, fill #3

## 2023-03-09 MED ORDER — HYDROCHLOROTHIAZIDE 12.5 MG PO CAPS
12.5000 mg | ORAL_CAPSULE | Freq: Every day | ORAL | 0 refills | Status: DC
  Filled 2023-03-09: qty 90, 90d supply, fill #0

## 2023-03-15 ENCOUNTER — Other Ambulatory Visit (HOSPITAL_COMMUNITY): Payer: Self-pay

## 2023-03-16 ENCOUNTER — Other Ambulatory Visit: Payer: Self-pay

## 2023-04-02 ENCOUNTER — Ambulatory Visit (INDEPENDENT_AMBULATORY_CARE_PROVIDER_SITE_OTHER): Payer: 59 | Admitting: Family Medicine

## 2023-04-02 ENCOUNTER — Encounter: Payer: Self-pay | Admitting: Family Medicine

## 2023-04-02 ENCOUNTER — Other Ambulatory Visit (HOSPITAL_COMMUNITY): Payer: Self-pay

## 2023-04-02 VITALS — BP 124/74 | Temp 98.7°F | Ht 62.0 in | Wt 161.4 lb

## 2023-04-02 DIAGNOSIS — Z0000 Encounter for general adult medical examination without abnormal findings: Secondary | ICD-10-CM | POA: Diagnosis not present

## 2023-04-02 DIAGNOSIS — Z1159 Encounter for screening for other viral diseases: Secondary | ICD-10-CM | POA: Diagnosis not present

## 2023-04-02 DIAGNOSIS — E119 Type 2 diabetes mellitus without complications: Secondary | ICD-10-CM

## 2023-04-02 DIAGNOSIS — E559 Vitamin D deficiency, unspecified: Secondary | ICD-10-CM | POA: Diagnosis not present

## 2023-04-02 LAB — HEPATIC FUNCTION PANEL
ALT: 24 U/L (ref 0–35)
AST: 16 U/L (ref 0–37)
Albumin: 4.6 g/dL (ref 3.5–5.2)
Alkaline Phosphatase: 50 U/L (ref 39–117)
Bilirubin, Direct: 0.2 mg/dL (ref 0.0–0.3)
Total Bilirubin: 1 mg/dL (ref 0.2–1.2)
Total Protein: 7.4 g/dL (ref 6.0–8.3)

## 2023-04-02 LAB — LIPID PANEL
Cholesterol: 122 mg/dL (ref 0–200)
HDL: 45.1 mg/dL (ref >39.00)
LDL Cholesterol: 59 mg/dL (ref 0–99)
NonHDL: 77.34
Total CHOL/HDL Ratio: 3
Triglycerides: 93 mg/dL (ref 0.0–149.0)
VLDL: 18.6 mg/dL (ref 0.0–40.0)

## 2023-04-02 LAB — HEMOGLOBIN A1C: Hgb A1c MFr Bld: 6.8 % — ABNORMAL HIGH (ref 4.6–6.5)

## 2023-04-02 LAB — CBC WITH DIFFERENTIAL/PLATELET
Basophils Absolute: 0 10*3/uL (ref 0.0–0.1)
Basophils Relative: 0.3 % (ref 0.0–3.0)
Eosinophils Absolute: 0.2 10*3/uL (ref 0.0–0.7)
Eosinophils Relative: 2.9 % (ref 0.0–5.0)
HCT: 42.6 % (ref 36.0–46.0)
Hemoglobin: 14.5 g/dL (ref 12.0–15.0)
Lymphocytes Relative: 30.6 % (ref 12.0–46.0)
Lymphs Abs: 2.2 10*3/uL (ref 0.7–4.0)
MCHC: 34 g/dL (ref 30.0–36.0)
MCV: 90.1 fL (ref 78.0–100.0)
Monocytes Absolute: 0.5 10*3/uL (ref 0.1–1.0)
Monocytes Relative: 7.4 % (ref 3.0–12.0)
Neutro Abs: 4.2 10*3/uL (ref 1.4–7.7)
Neutrophils Relative %: 58.8 % (ref 43.0–77.0)
Platelets: 277 10*3/uL (ref 150.0–400.0)
RBC: 4.72 Mil/uL (ref 3.87–5.11)
RDW: 13.1 % (ref 11.5–15.5)
WBC: 7.1 10*3/uL (ref 4.0–10.5)

## 2023-04-02 LAB — BASIC METABOLIC PANEL
BUN: 14 mg/dL (ref 6–23)
CO2: 27 meq/L (ref 19–32)
Calcium: 9.3 mg/dL (ref 8.4–10.5)
Chloride: 101 meq/L (ref 96–112)
Creatinine, Ser: 0.65 mg/dL (ref 0.40–1.20)
GFR: 103.64 mL/min (ref >60.00)
Glucose, Bld: 113 mg/dL — ABNORMAL HIGH (ref 70–99)
Potassium: 3.9 meq/L (ref 3.5–5.1)
Sodium: 139 meq/L (ref 135–145)

## 2023-04-02 LAB — MICROALBUMIN / CREATININE URINE RATIO
Creatinine,U: 208.6 mg/dL
Microalb Creat Ratio: 2 mg/g (ref 0.0–30.0)
Microalb, Ur: 4.3 mg/dL — ABNORMAL HIGH (ref 0.0–1.9)

## 2023-04-02 LAB — VITAMIN D 25 HYDROXY (VIT D DEFICIENCY, FRACTURES): VITD: 52.13 ng/mL (ref 30.00–100.00)

## 2023-04-02 LAB — TSH: TSH: 0.93 u[IU]/mL (ref 0.35–5.50)

## 2023-04-02 MED ORDER — LABETALOL HCL 200 MG PO TABS
200.0000 mg | ORAL_TABLET | Freq: Two times a day (BID) | ORAL | 0 refills | Status: DC
  Filled 2023-04-02: qty 180, 90d supply, fill #0

## 2023-04-02 NOTE — Patient Instructions (Signed)
Follow up in 6 months to recheck BP and cholesterol We'll notify you of your lab results and make any changes if needed Continue to work on healthy diet and regular exercise Call with any questions or concerns Stay Safe!  Stay Healthy! Happy Holidays!!!

## 2023-04-02 NOTE — Assessment & Plan Note (Signed)
Check labs.  Get microalbumin.

## 2023-04-02 NOTE — Assessment & Plan Note (Signed)
Check labs and replete prn. 

## 2023-04-02 NOTE — Progress Notes (Signed)
   Subjective:    Patient ID: Amy Torres, female    DOB: [birth year], 47 y.o.   MRN: [id number]  HPI CPE- UTD on eye exam, foot exam, pap, colonoscopy, Tdap.  Mammo scheduled.  Due for microalbumin.  Patient Care Team    Relationship Specialty Notifications Start End  Sheliah Hatch, MD PCP - General   03/27/10   Lavina Hamman, MD Consulting Physician Obstetrics and Gynecology  05/24/15     Health Maintenance  Topic Date Due   Hepatitis C Screening  Never done   OPHTHALMOLOGY EXAM  04/27/2022   Diabetic kidney evaluation - Urine ACR  10/31/2022   INFLUENZA VACCINE  11/13/2022   COVID-19 Vaccine (1 - 2024-25 season) Never done   MAMMOGRAM  03/19/2023   HEMOGLOBIN A1C  03/26/2023   Diabetic kidney evaluation - eGFR measurement  05/03/2023   FOOT EXAM  10/01/2023   Cervical Cancer Screening (HPV/Pap Cotest)  03/27/2027   Colonoscopy  05/27/2027   DTaP/Tdap/Td (4 - Td or Tdap) 07/05/2031   HIV Screening  Completed   HPV VACCINES  Aged Out      Review of Systems Patient reports no vision/ hearing changes, adenopathy,fever, weight change,  persistant/recurrent hoarseness , swallowing issues, chest pain, palpitations, edema, persistant/recurrent cough, hemoptysis, dyspnea (rest/exertional/paroxysmal nocturnal), gastrointestinal bleeding (melena, rectal bleeding), abdominal pain, significant heartburn, bowel changes, GU symptoms (dysuria, hematuria, incontinence), Gyn symptoms (abnormal  bleeding, pain),  syncope, focal weakness, memory loss, numbness & tingling, skin/hair/nail changes, abnormal bruising or bleeding, anxiety, or depression.     Objective:   Physical Exam General Appearance:    Alert, cooperative, no distress, appears stated age  Head:    Normocephalic, without obvious abnormality, atraumatic  Eyes:    PERRL, conjunctiva/corneas clear, EOM's intact both eyes  Ears:    Normal TM's and external ear canals, both ears  Nose:   Nares normal, septum midline, mucosa  normal, no drainage    or sinus tenderness  Throat:   Lips, mucosa, and tongue normal; teeth and gums normal  Neck:   Supple, symmetrical, trachea midline, no adenopathy;    Thyroid: no enlargement/tenderness/nodules  Back:     Symmetric, no curvature, ROM normal, no CVA tenderness  Lungs:     Clear to auscultation bilaterally, respirations unlabored  Chest Wall:    No tenderness or deformity   Heart:    Regular rate and rhythm, S1 and S2 normal, no murmur, rub   or gallop  Breast Exam:    Deferred to GYN  Abdomen:     Soft, non-tender, bowel sounds active all four quadrants,    no masses, no organomegaly  Genitalia:    Deferred to GYN  Rectal:    Extremities:   Extremities normal, atraumatic, no cyanosis or edema  Pulses:   2+ and symmetric all extremities  Skin:   Skin color, texture, turgor normal, no rashes or lesions  Lymph nodes:   Cervical, supraclavicular, and axillary nodes normal  Neurologic:   CNII-XII intact, normal strength, sensation and reflexes    throughout          Assessment & Plan:

## 2023-04-02 NOTE — Assessment & Plan Note (Signed)
Pt's PE WNL w/ exception of BMI.  UTD on pap, colonoscopy.  Mammo scheduled.  UTD on flu and Tdap.  Check labs.  Anticipatory guidance provided.

## 2023-04-03 ENCOUNTER — Telehealth: Payer: Self-pay

## 2023-04-03 LAB — HEPATITIS C ANTIBODY: Hepatitis C Ab: NONREACTIVE

## 2023-04-03 NOTE — Telephone Encounter (Signed)
-----   Message from Neena Rhymes sent at 04/03/2023  3:41 PM EST ----- Labs look good!  A1C has increased from 6.1 --> 6.8% but that is likely due to holiday eating.  Try and work on low carb/low sugar diet and regular exercise.  No med changes at this time

## 2023-04-06 NOTE — Telephone Encounter (Signed)
Pt has been notified.

## 2023-04-17 ENCOUNTER — Other Ambulatory Visit (HOSPITAL_COMMUNITY): Payer: Self-pay

## 2023-05-08 ENCOUNTER — Other Ambulatory Visit (HOSPITAL_COMMUNITY): Payer: Self-pay

## 2023-05-12 ENCOUNTER — Other Ambulatory Visit: Payer: Self-pay

## 2023-05-12 ENCOUNTER — Other Ambulatory Visit (HOSPITAL_COMMUNITY): Payer: Self-pay

## 2023-05-12 ENCOUNTER — Other Ambulatory Visit: Payer: Self-pay | Admitting: Family Medicine

## 2023-05-12 MED ORDER — BETAMETHASONE DIPROPIONATE 0.05 % EX CREA
TOPICAL_CREAM | Freq: Two times a day (BID) | CUTANEOUS | 1 refills | Status: AC
  Filled 2023-05-12: qty 45, 30d supply, fill #0
  Filled 2023-09-16: qty 45, 30d supply, fill #1

## 2023-05-12 NOTE — Telephone Encounter (Signed)
Requested Prescriptions   Pending Prescriptions Disp Refills   betamethasone dipropionate 0.05 % cream 45 g 1    Sig: Apply topically 2 (two) times daily.     Date of patient request: 05/12/2023 Last office visit: 04/02/2023 Upcoming visit: 09/29/2023 Date of last refill: 10/30/2021 Last refill amount: 45g x1 refill  Should patient make an appointment to get topical refilled?

## 2023-05-13 ENCOUNTER — Other Ambulatory Visit (HOSPITAL_COMMUNITY): Payer: Self-pay

## 2023-06-01 ENCOUNTER — Other Ambulatory Visit: Payer: Self-pay | Admitting: Family Medicine

## 2023-06-02 ENCOUNTER — Other Ambulatory Visit (HOSPITAL_COMMUNITY): Payer: Self-pay

## 2023-06-02 MED ORDER — HYDROCHLOROTHIAZIDE 12.5 MG PO CAPS
12.5000 mg | ORAL_CAPSULE | Freq: Every day | ORAL | 0 refills | Status: DC
  Filled 2023-06-02: qty 90, 90d supply, fill #0

## 2023-06-10 DIAGNOSIS — E1165 Type 2 diabetes mellitus with hyperglycemia: Secondary | ICD-10-CM | POA: Diagnosis not present

## 2023-06-19 ENCOUNTER — Other Ambulatory Visit: Payer: Self-pay

## 2023-06-19 ENCOUNTER — Other Ambulatory Visit (HOSPITAL_COMMUNITY): Payer: Self-pay

## 2023-06-19 DIAGNOSIS — E1165 Type 2 diabetes mellitus with hyperglycemia: Secondary | ICD-10-CM | POA: Diagnosis not present

## 2023-06-19 DIAGNOSIS — E785 Hyperlipidemia, unspecified: Secondary | ICD-10-CM | POA: Diagnosis not present

## 2023-06-19 DIAGNOSIS — I10 Essential (primary) hypertension: Secondary | ICD-10-CM | POA: Diagnosis not present

## 2023-06-19 MED ORDER — OZEMPIC (2 MG/DOSE) 8 MG/3ML ~~LOC~~ SOPN
2.0000 mg | PEN_INJECTOR | SUBCUTANEOUS | 5 refills | Status: AC
  Filled 2023-06-19 – 2023-07-01 (×3): qty 3, 28d supply, fill #0
  Filled 2023-07-31: qty 3, 28d supply, fill #1
  Filled 2023-08-21 – 2023-08-24 (×2): qty 3, 28d supply, fill #2
  Filled 2023-10-09: qty 3, 28d supply, fill #3
  Filled 2024-03-23: qty 3, 28d supply, fill #4
  Filled 2024-04-18 – 2024-05-11 (×3): qty 3, 28d supply, fill #0

## 2023-06-19 MED ORDER — LOSARTAN POTASSIUM 25 MG PO TABS
25.0000 mg | ORAL_TABLET | Freq: Every day | ORAL | 3 refills | Status: DC
  Filled 2023-06-19: qty 30, 30d supply, fill #0
  Filled 2023-07-16: qty 30, 30d supply, fill #1
  Filled 2023-08-21: qty 30, 30d supply, fill #2
  Filled 2023-09-16: qty 30, 30d supply, fill #3

## 2023-06-29 ENCOUNTER — Other Ambulatory Visit (HOSPITAL_COMMUNITY): Payer: Self-pay

## 2023-07-01 ENCOUNTER — Other Ambulatory Visit (HOSPITAL_COMMUNITY): Payer: Self-pay

## 2023-07-09 ENCOUNTER — Other Ambulatory Visit (HOSPITAL_COMMUNITY): Payer: Self-pay

## 2023-07-11 ENCOUNTER — Other Ambulatory Visit: Payer: Self-pay | Admitting: Family Medicine

## 2023-07-11 DIAGNOSIS — Z0000 Encounter for general adult medical examination without abnormal findings: Secondary | ICD-10-CM

## 2023-07-15 ENCOUNTER — Ambulatory Visit
Admission: RE | Admit: 2023-07-15 | Discharge: 2023-07-15 | Disposition: A | Discharge status: Home / Self Care | Source: Ambulatory Visit | Attending: Family Medicine | Admitting: Family Medicine

## 2023-07-15 DIAGNOSIS — Z1231 Encounter for screening mammogram for malignant neoplasm of breast: Secondary | ICD-10-CM | POA: Diagnosis not present

## 2023-07-15 DIAGNOSIS — Z0000 Encounter for general adult medical examination without abnormal findings: Secondary | ICD-10-CM

## 2023-07-16 ENCOUNTER — Other Ambulatory Visit (HOSPITAL_COMMUNITY): Payer: Self-pay

## 2023-07-16 ENCOUNTER — Other Ambulatory Visit: Payer: Self-pay | Admitting: Family Medicine

## 2023-07-16 DIAGNOSIS — E785 Hyperlipidemia, unspecified: Secondary | ICD-10-CM

## 2023-07-16 MED ORDER — ATORVASTATIN CALCIUM 20 MG PO TABS
20.0000 mg | ORAL_TABLET | Freq: Every day | ORAL | 3 refills | Status: DC
  Filled 2023-07-16: qty 30, 30d supply, fill #0
  Filled 2023-08-21: qty 30, 30d supply, fill #1
  Filled 2023-09-16: qty 30, 30d supply, fill #2
  Filled 2023-10-19: qty 30, 30d supply, fill #3

## 2023-08-21 ENCOUNTER — Other Ambulatory Visit (HOSPITAL_COMMUNITY): Payer: Self-pay

## 2023-08-21 ENCOUNTER — Other Ambulatory Visit: Payer: Self-pay

## 2023-08-31 DIAGNOSIS — M7671 Peroneal tendinitis, right leg: Secondary | ICD-10-CM | POA: Diagnosis not present

## 2023-09-16 ENCOUNTER — Other Ambulatory Visit: Payer: Self-pay | Admitting: Family Medicine

## 2023-09-16 ENCOUNTER — Other Ambulatory Visit: Payer: Self-pay

## 2023-09-16 ENCOUNTER — Other Ambulatory Visit (HOSPITAL_COMMUNITY): Payer: Self-pay

## 2023-09-16 MED ORDER — HYDROCHLOROTHIAZIDE 12.5 MG PO CAPS
12.5000 mg | ORAL_CAPSULE | Freq: Every day | ORAL | 0 refills | Status: DC
Start: 2023-09-16 — End: 2023-11-17
  Filled 2023-09-16: qty 90, 90d supply, fill #0

## 2023-09-17 ENCOUNTER — Other Ambulatory Visit (HOSPITAL_COMMUNITY): Payer: Self-pay

## 2023-09-23 DIAGNOSIS — M25571 Pain in right ankle and joints of right foot: Secondary | ICD-10-CM | POA: Diagnosis not present

## 2023-09-28 DIAGNOSIS — M7712 Lateral epicondylitis, left elbow: Secondary | ICD-10-CM | POA: Diagnosis not present

## 2023-09-29 ENCOUNTER — Ambulatory Visit (INDEPENDENT_AMBULATORY_CARE_PROVIDER_SITE_OTHER): Payer: 59 | Admitting: Family Medicine

## 2023-09-29 ENCOUNTER — Encounter: Payer: Self-pay | Admitting: Family Medicine

## 2023-09-29 VITALS — BP 130/82 | HR 74 | Temp 98.3°F | Ht 62.0 in | Wt 163.2 lb

## 2023-09-29 DIAGNOSIS — E785 Hyperlipidemia, unspecified: Secondary | ICD-10-CM

## 2023-09-29 DIAGNOSIS — Z7985 Long-term (current) use of injectable non-insulin antidiabetic drugs: Secondary | ICD-10-CM

## 2023-09-29 DIAGNOSIS — E1169 Type 2 diabetes mellitus with other specified complication: Secondary | ICD-10-CM | POA: Diagnosis not present

## 2023-09-29 DIAGNOSIS — E119 Type 2 diabetes mellitus without complications: Secondary | ICD-10-CM

## 2023-09-29 DIAGNOSIS — I10 Essential (primary) hypertension: Secondary | ICD-10-CM | POA: Diagnosis not present

## 2023-09-29 LAB — BASIC METABOLIC PANEL WITH GFR
BUN: 16 mg/dL (ref 6–23)
CO2: 30 meq/L (ref 19–32)
Calcium: 9 mg/dL (ref 8.4–10.5)
Chloride: 101 meq/L (ref 96–112)
Creatinine, Ser: 0.59 mg/dL (ref 0.40–1.20)
GFR: 105.73 mL/min (ref >60.00)
Glucose, Bld: 113 mg/dL — ABNORMAL HIGH (ref 70–99)
Potassium: 3.5 meq/L (ref 3.5–5.1)
Sodium: 139 meq/L (ref 135–145)

## 2023-09-29 LAB — HEPATIC FUNCTION PANEL
ALT: 20 U/L (ref 0–35)
AST: 16 U/L (ref 0–37)
Albumin: 4.2 g/dL (ref 3.5–5.2)
Alkaline Phosphatase: 43 U/L (ref 39–117)
Bilirubin, Direct: 0.1 mg/dL (ref 0.0–0.3)
Total Bilirubin: 0.6 mg/dL (ref 0.2–1.2)
Total Protein: 7.3 g/dL (ref 6.0–8.3)

## 2023-09-29 LAB — CBC WITH DIFFERENTIAL/PLATELET
Basophils Absolute: 0 10*3/uL (ref 0.0–0.1)
Basophils Relative: 0.4 % (ref 0.0–3.0)
Eosinophils Absolute: 0.1 10*3/uL (ref 0.0–0.7)
Eosinophils Relative: 2.1 % (ref 0.0–5.0)
HCT: 39.4 % (ref 36.0–46.0)
Hemoglobin: 13.6 g/dL (ref 12.0–15.0)
Lymphocytes Relative: 37.4 % (ref 12.0–46.0)
Lymphs Abs: 2.5 10*3/uL (ref 0.7–4.0)
MCHC: 34.6 g/dL (ref 30.0–36.0)
MCV: 86.9 fl (ref 78.0–100.0)
Monocytes Absolute: 0.5 10*3/uL (ref 0.1–1.0)
Monocytes Relative: 7.9 % (ref 3.0–12.0)
Neutro Abs: 3.5 10*3/uL (ref 1.4–7.7)
Neutrophils Relative %: 52.2 % (ref 43.0–77.0)
Platelets: 290 10*3/uL (ref 150.0–400.0)
RBC: 4.53 Mil/uL (ref 3.87–5.11)
RDW: 12.5 % (ref 11.5–15.5)
WBC: 6.7 10*3/uL (ref 4.0–10.5)

## 2023-09-29 LAB — LIPID PANEL
Cholesterol: 116 mg/dL (ref 0–200)
HDL: 38.4 mg/dL — ABNORMAL LOW (ref >39.00)
LDL Cholesterol: 44 mg/dL (ref 0–99)
NonHDL: 77.24
Total CHOL/HDL Ratio: 3
Triglycerides: 165 mg/dL — ABNORMAL HIGH (ref 0.0–149.0)
VLDL: 33 mg/dL (ref 0.0–40.0)

## 2023-09-29 LAB — TSH: TSH: 1.29 u[IU]/mL (ref 0.35–5.50)

## 2023-09-29 LAB — HEMOGLOBIN A1C: Hgb A1c MFr Bld: 6.7 % — ABNORMAL HIGH (ref 4.6–6.5)

## 2023-09-29 NOTE — Assessment & Plan Note (Signed)
Chronic problem.  On Lipitor 20mg daily w/o difficulty.  Check labs.  Adjust meds prn  

## 2023-09-29 NOTE — Progress Notes (Signed)
   Subjective:    Patient ID: Amy Torres, female    DOB: 06-19-1975, 48 y.o.   MRN: [id number]  HPI DM- chronic problem, on Ozempic  2mg  weekly.  On Losartan  for renal protection.  UTD on eye exam, foot exam, microalbumin.  No numbness/tingling of hands/feet.  Hyperlipidemia- chronic problem, on Lipitor 20mg  daily.  No abd pain, N/V.  HTN- chronic problem, on hydrochlorothiazide  12.5mg  daily, Labetalol  200mg  BID, Losartan  25mg  daily w/ good control.  No CP, SOB, HA's, visual changes, edema.   Review of Systems For ROS see HPI     Objective:   Physical Exam Vitals reviewed.  Constitutional:      General: She is not in acute distress.    Appearance: Normal appearance. She is well-developed. She is not ill-appearing.  HENT:     Head: Normocephalic and atraumatic.   Eyes:     Conjunctiva/sclera: Conjunctivae normal.     Pupils: Pupils are equal, round, and reactive to light.   Neck:     Thyroid : No thyromegaly.   Cardiovascular:     Rate and Rhythm: Normal rate and regular rhythm.     Pulses: Normal pulses.     Heart sounds: Normal heart sounds. No murmur heard. Pulmonary:     Effort: Pulmonary effort is normal. No respiratory distress.     Breath sounds: Normal breath sounds.  Abdominal:     General: There is no distension.     Palpations: Abdomen is soft.     Tenderness: There is no abdominal tenderness.   Musculoskeletal:     Cervical back: Normal range of motion and neck supple.     Right lower leg: No edema.     Left lower leg: No edema.  Lymphadenopathy:     Cervical: No cervical adenopathy.   Skin:    General: Skin is warm and dry.   Neurological:     General: No focal deficit present.     Mental Status: She is alert and oriented to person, place, and time.   Psychiatric:        Mood and Affect: Mood normal.        Behavior: Behavior normal.        Thought Content: Thought content normal.           Assessment & Plan:

## 2023-09-29 NOTE — Patient Instructions (Signed)
Schedule your complete physical in 6 months We'll notify you of your lab results and make any changes if needed Keep up the good work on healthy diet and regular exercise- you look great!!! Call with any questions or concerns Stay Safe!  Stay Healthy! Have a great summer!!! 

## 2023-09-29 NOTE — Assessment & Plan Note (Signed)
 Chronic problem.  On Ozempic  2mg  weekly.  UTD on eye exam, foot exam, microalbumin.  Check labs.  Adjust meds prn

## 2023-09-29 NOTE — Assessment & Plan Note (Signed)
 Chronic problem.  On Losartan , Labetalol , and hydrochlorothiazide  w/ good control.  Currently asymptomatic.  Check labs due to ARB and diuretic use but no anticipated med changes.  Will follow.

## 2023-09-30 ENCOUNTER — Ambulatory Visit: Payer: Self-pay | Admitting: Family Medicine

## 2023-09-30 DIAGNOSIS — N926 Irregular menstruation, unspecified: Secondary | ICD-10-CM | POA: Diagnosis not present

## 2023-09-30 DIAGNOSIS — Z130 Encounter for screening for diseases of the blood and blood-forming organs and certain disorders involving the immune mechanism: Secondary | ICD-10-CM | POA: Diagnosis not present

## 2023-09-30 DIAGNOSIS — Z01411 Encounter for gynecological examination (general) (routine) with abnormal findings: Secondary | ICD-10-CM | POA: Diagnosis not present

## 2023-09-30 DIAGNOSIS — Z1389 Encounter for screening for other disorder: Secondary | ICD-10-CM | POA: Diagnosis not present

## 2023-09-30 NOTE — Progress Notes (Signed)
 Pts phone is not working. Unable to leave message

## 2023-10-02 DIAGNOSIS — M25561 Pain in right knee: Secondary | ICD-10-CM | POA: Diagnosis not present

## 2023-10-07 DIAGNOSIS — M7712 Lateral epicondylitis, left elbow: Secondary | ICD-10-CM | POA: Diagnosis not present

## 2023-10-17 ENCOUNTER — Other Ambulatory Visit (HOSPITAL_COMMUNITY): Payer: Self-pay

## 2023-10-19 ENCOUNTER — Other Ambulatory Visit (HOSPITAL_COMMUNITY): Payer: Self-pay

## 2023-10-19 MED ORDER — LOSARTAN POTASSIUM 25 MG PO TABS
25.0000 mg | ORAL_TABLET | Freq: Every day | ORAL | 3 refills | Status: AC
  Filled 2023-10-19 – 2024-04-18 (×2): qty 30, 30d supply, fill #0

## 2023-10-21 ENCOUNTER — Other Ambulatory Visit (HOSPITAL_COMMUNITY): Payer: Self-pay

## 2023-10-21 DIAGNOSIS — E1165 Type 2 diabetes mellitus with hyperglycemia: Secondary | ICD-10-CM | POA: Diagnosis not present

## 2023-10-21 DIAGNOSIS — M25571 Pain in right ankle and joints of right foot: Secondary | ICD-10-CM | POA: Diagnosis not present

## 2023-10-28 ENCOUNTER — Other Ambulatory Visit (HOSPITAL_COMMUNITY): Payer: Self-pay

## 2023-10-28 DIAGNOSIS — E785 Hyperlipidemia, unspecified: Secondary | ICD-10-CM | POA: Diagnosis not present

## 2023-10-28 DIAGNOSIS — E1165 Type 2 diabetes mellitus with hyperglycemia: Secondary | ICD-10-CM | POA: Diagnosis not present

## 2023-10-28 DIAGNOSIS — I10 Essential (primary) hypertension: Secondary | ICD-10-CM | POA: Diagnosis not present

## 2023-10-28 MED ORDER — ATORVASTATIN CALCIUM 20 MG PO TABS
20.0000 mg | ORAL_TABLET | Freq: Every day | ORAL | 1 refills | Status: AC
  Filled 2023-11-18: qty 90, 90d supply, fill #0
  Filled 2024-02-18: qty 90, 90d supply, fill #1

## 2023-10-28 MED ORDER — LOSARTAN POTASSIUM 25 MG PO TABS
25.0000 mg | ORAL_TABLET | Freq: Every day | ORAL | 1 refills | Status: DC
  Filled 2023-11-17: qty 90, 90d supply, fill #0
  Filled 2024-01-24 – 2024-02-03 (×2): qty 90, 90d supply, fill #1

## 2023-10-28 MED ORDER — OZEMPIC (2 MG/DOSE) 8 MG/3ML ~~LOC~~ SOPN
2.0000 mg | PEN_INJECTOR | SUBCUTANEOUS | 1 refills | Status: AC
  Filled 2023-11-04: qty 9, 84d supply, fill #0
  Filled 2024-01-24: qty 9, 84d supply, fill #1
  Filled 2024-01-27: qty 3, 28d supply, fill #1
  Filled 2024-02-18 – 2024-02-19 (×2): qty 3, 28d supply, fill #2
  Filled 2024-04-18: qty 3, 28d supply, fill #0

## 2023-11-04 ENCOUNTER — Other Ambulatory Visit: Payer: Self-pay

## 2023-11-10 DIAGNOSIS — M7989 Other specified soft tissue disorders: Secondary | ICD-10-CM | POA: Diagnosis not present

## 2023-11-17 ENCOUNTER — Other Ambulatory Visit (HOSPITAL_COMMUNITY): Payer: Self-pay

## 2023-11-17 ENCOUNTER — Other Ambulatory Visit: Payer: Self-pay | Admitting: Family Medicine

## 2023-11-18 ENCOUNTER — Other Ambulatory Visit: Payer: Self-pay

## 2023-11-18 ENCOUNTER — Other Ambulatory Visit (HOSPITAL_COMMUNITY): Payer: Self-pay

## 2023-11-18 MED ORDER — HYDROCHLOROTHIAZIDE 12.5 MG PO CAPS
12.5000 mg | ORAL_CAPSULE | Freq: Every day | ORAL | 0 refills | Status: DC
  Filled 2023-11-18 – 2023-12-09 (×2): qty 90, 90d supply, fill #0

## 2023-12-08 ENCOUNTER — Other Ambulatory Visit: Payer: Self-pay | Admitting: Family Medicine

## 2023-12-09 ENCOUNTER — Other Ambulatory Visit (HOSPITAL_COMMUNITY): Payer: Self-pay

## 2023-12-09 ENCOUNTER — Other Ambulatory Visit: Payer: Self-pay

## 2023-12-09 MED ORDER — LABETALOL HCL 200 MG PO TABS
200.0000 mg | ORAL_TABLET | Freq: Two times a day (BID) | ORAL | 0 refills | Status: AC
  Filled 2023-12-09: qty 180, 90d supply, fill #0

## 2024-01-25 ENCOUNTER — Other Ambulatory Visit (HOSPITAL_COMMUNITY): Payer: Self-pay

## 2024-01-27 ENCOUNTER — Other Ambulatory Visit (HOSPITAL_COMMUNITY): Payer: Self-pay

## 2024-01-27 DIAGNOSIS — E1165 Type 2 diabetes mellitus with hyperglycemia: Secondary | ICD-10-CM | POA: Diagnosis not present

## 2024-01-28 ENCOUNTER — Other Ambulatory Visit (HOSPITAL_COMMUNITY): Payer: Self-pay

## 2024-02-03 ENCOUNTER — Other Ambulatory Visit (HOSPITAL_COMMUNITY): Payer: Self-pay

## 2024-02-03 ENCOUNTER — Other Ambulatory Visit: Payer: Self-pay | Admitting: Family Medicine

## 2024-02-03 DIAGNOSIS — E785 Hyperlipidemia, unspecified: Secondary | ICD-10-CM | POA: Diagnosis not present

## 2024-02-03 DIAGNOSIS — I10 Essential (primary) hypertension: Secondary | ICD-10-CM | POA: Diagnosis not present

## 2024-02-03 DIAGNOSIS — E1165 Type 2 diabetes mellitus with hyperglycemia: Secondary | ICD-10-CM | POA: Diagnosis not present

## 2024-02-03 MED ORDER — JARDIANCE 10 MG PO TABS
10.0000 mg | ORAL_TABLET | Freq: Every day | ORAL | 5 refills | Status: AC
  Filled 2024-02-03: qty 30, 30d supply, fill #0
  Filled 2024-03-14: qty 30, 30d supply, fill #1
  Filled 2024-04-11: qty 30, 30d supply, fill #2
  Filled 2024-04-18: qty 30, 30d supply, fill #0
  Filled 2024-05-17: qty 90, 90d supply, fill #0
  Filled 2024-05-17 (×2): qty 30, 30d supply, fill #0
  Filled 2024-05-17: qty 90, 90d supply, fill #0
  Filled 2024-05-17 (×2): qty 30, 30d supply, fill #0

## 2024-02-18 ENCOUNTER — Other Ambulatory Visit (HOSPITAL_COMMUNITY): Payer: Self-pay

## 2024-02-18 ENCOUNTER — Other Ambulatory Visit: Payer: Self-pay

## 2024-02-19 ENCOUNTER — Other Ambulatory Visit (HOSPITAL_COMMUNITY): Payer: Self-pay

## 2024-03-04 DIAGNOSIS — M7989 Other specified soft tissue disorders: Secondary | ICD-10-CM | POA: Diagnosis not present

## 2024-03-09 ENCOUNTER — Other Ambulatory Visit (HOSPITAL_COMMUNITY): Payer: Self-pay

## 2024-03-11 ENCOUNTER — Other Ambulatory Visit (HOSPITAL_COMMUNITY): Payer: Self-pay

## 2024-03-11 DIAGNOSIS — D2121 Benign neoplasm of connective and other soft tissue of right lower limb, including hip: Secondary | ICD-10-CM | POA: Diagnosis not present

## 2024-03-11 DIAGNOSIS — M7989 Other specified soft tissue disorders: Secondary | ICD-10-CM | POA: Diagnosis not present

## 2024-03-11 MED ORDER — ONDANSETRON HCL 4 MG PO TABS
4.0000 mg | ORAL_TABLET | Freq: Three times a day (TID) | ORAL | 0 refills | Status: AC | PRN
Start: 2024-03-11 — End: ?
  Filled 2024-03-11: qty 20, 7d supply, fill #0

## 2024-03-11 MED ORDER — HYDROCODONE-ACETAMINOPHEN 5-325 MG PO TABS
1.0000 | ORAL_TABLET | Freq: Four times a day (QID) | ORAL | 0 refills | Status: AC | PRN
  Filled 2024-03-11: qty 20, 5d supply, fill #0

## 2024-03-14 ENCOUNTER — Other Ambulatory Visit (HOSPITAL_COMMUNITY): Payer: Self-pay

## 2024-03-19 ENCOUNTER — Other Ambulatory Visit (HOSPITAL_COMMUNITY): Payer: Self-pay

## 2024-03-19 MED ORDER — TRAMADOL HCL 50 MG PO TABS
50.0000 mg | ORAL_TABLET | Freq: Four times a day (QID) | ORAL | 0 refills | Status: AC | PRN
  Filled 2024-03-19: qty 20, 5d supply, fill #0

## 2024-03-20 ENCOUNTER — Other Ambulatory Visit: Payer: Self-pay

## 2024-03-21 ENCOUNTER — Other Ambulatory Visit (HOSPITAL_COMMUNITY): Payer: Self-pay

## 2024-03-23 ENCOUNTER — Other Ambulatory Visit: Payer: Self-pay | Admitting: Family Medicine

## 2024-03-23 ENCOUNTER — Other Ambulatory Visit (HOSPITAL_COMMUNITY): Payer: Self-pay

## 2024-03-23 MED ORDER — HYDROCHLOROTHIAZIDE 12.5 MG PO CAPS
12.5000 mg | ORAL_CAPSULE | Freq: Every day | ORAL | 0 refills | Status: AC
  Filled 2024-03-23: qty 90, 90d supply, fill #0

## 2024-03-24 DIAGNOSIS — Z4789 Encounter for other orthopedic aftercare: Secondary | ICD-10-CM | POA: Diagnosis not present

## 2024-04-05 ENCOUNTER — Encounter: Admitting: Family Medicine

## 2024-04-11 ENCOUNTER — Other Ambulatory Visit (HOSPITAL_COMMUNITY): Payer: Self-pay

## 2024-04-11 ENCOUNTER — Encounter (HOSPITAL_COMMUNITY): Payer: Self-pay

## 2024-04-12 ENCOUNTER — Other Ambulatory Visit (HOSPITAL_COMMUNITY): Payer: Self-pay

## 2024-04-13 ENCOUNTER — Other Ambulatory Visit: Payer: Self-pay

## 2024-04-18 ENCOUNTER — Other Ambulatory Visit (HOSPITAL_COMMUNITY): Payer: Self-pay

## 2024-04-19 ENCOUNTER — Other Ambulatory Visit (HOSPITAL_COMMUNITY): Payer: Self-pay

## 2024-04-25 ENCOUNTER — Other Ambulatory Visit: Payer: Self-pay

## 2024-04-25 ENCOUNTER — Encounter (HOSPITAL_COMMUNITY): Payer: Self-pay

## 2024-04-25 ENCOUNTER — Other Ambulatory Visit (HOSPITAL_COMMUNITY): Payer: Self-pay

## 2024-04-25 MED ORDER — GABAPENTIN 100 MG PO CAPS
ORAL_CAPSULE | ORAL | 0 refills | Status: AC
  Filled 2024-04-25: qty 261, 90d supply, fill #0
  Filled 2024-04-25: qty 261, 81d supply, fill #0

## 2024-04-26 ENCOUNTER — Other Ambulatory Visit (HOSPITAL_COMMUNITY): Payer: Self-pay

## 2024-04-27 ENCOUNTER — Other Ambulatory Visit (HOSPITAL_COMMUNITY): Payer: Self-pay

## 2024-04-27 MED ORDER — FLUCONAZOLE 150 MG PO TABS
150.0000 mg | ORAL_TABLET | Freq: Once | ORAL | 0 refills | Status: AC
  Filled 2024-04-27: qty 1, 1d supply, fill #0

## 2024-05-03 ENCOUNTER — Other Ambulatory Visit: Payer: Self-pay | Admitting: Family Medicine

## 2024-05-03 ENCOUNTER — Other Ambulatory Visit (HOSPITAL_COMMUNITY): Payer: Self-pay

## 2024-05-03 ENCOUNTER — Other Ambulatory Visit: Payer: Self-pay

## 2024-05-03 MED ORDER — LOSARTAN POTASSIUM 25 MG PO TABS
25.0000 mg | ORAL_TABLET | Freq: Every day | ORAL | 1 refills | Status: AC
  Filled 2024-05-03: qty 90, 90d supply, fill #0

## 2024-05-03 MED ORDER — FLUTICASONE PROPIONATE HFA 110 MCG/ACT IN AERO
2.0000 | INHALATION_SPRAY | Freq: Two times a day (BID) | RESPIRATORY_TRACT | 6 refills | Status: AC
  Filled 2024-05-03: qty 12, 30d supply, fill #0

## 2024-05-04 ENCOUNTER — Encounter: Payer: Self-pay | Admitting: Pharmacist

## 2024-05-04 ENCOUNTER — Other Ambulatory Visit (HOSPITAL_COMMUNITY): Payer: Self-pay

## 2024-05-04 ENCOUNTER — Other Ambulatory Visit: Payer: Self-pay

## 2024-05-05 ENCOUNTER — Other Ambulatory Visit: Payer: Self-pay

## 2024-05-11 ENCOUNTER — Other Ambulatory Visit (HOSPITAL_COMMUNITY): Payer: Self-pay

## 2024-05-17 ENCOUNTER — Other Ambulatory Visit: Payer: Self-pay

## 2024-05-17 ENCOUNTER — Other Ambulatory Visit (HOSPITAL_COMMUNITY): Payer: Self-pay

## 2024-06-22 ENCOUNTER — Encounter: Admitting: Family Medicine
